# Patient Record
Sex: Female | Born: 1970 | Race: White | Hispanic: No | Marital: Married | Smoking: Never smoker
Health system: Southern US, Community
[De-identification: ages and names within clinical notes are randomized; demographics above are authoritative.]

## PROBLEM LIST (undated history)

## (undated) DIAGNOSIS — E7800 Pure hypercholesterolemia, unspecified: Secondary | ICD-10-CM

## (undated) DIAGNOSIS — N920 Excessive and frequent menstruation with regular cycle: Secondary | ICD-10-CM

## (undated) DIAGNOSIS — N390 Urinary tract infection, site not specified: Secondary | ICD-10-CM

## (undated) DIAGNOSIS — R87619 Unspecified abnormal cytological findings in specimens from cervix uteri: Secondary | ICD-10-CM

## (undated) DIAGNOSIS — F419 Anxiety disorder, unspecified: Secondary | ICD-10-CM

## (undated) HISTORY — PX: LITHOTRIPSY: SUR834

## (undated) HISTORY — DX: Urinary tract infection, site not specified: N39.0

## (undated) HISTORY — DX: Anxiety disorder, unspecified: F41.9

## (undated) HISTORY — DX: Unspecified abnormal cytological findings in specimens from cervix uteri: R87.619

## (undated) HISTORY — DX: Excessive and frequent menstruation with regular cycle: N92.0

---

## 1997-06-01 HISTORY — PX: LEEP: SHX91

## 2006-03-26 ENCOUNTER — Ambulatory Visit: Payer: Self-pay | Admitting: Unknown Physician Specialty

## 2006-04-08 ENCOUNTER — Ambulatory Visit: Payer: Self-pay | Admitting: Urology

## 2006-05-06 ENCOUNTER — Ambulatory Visit: Payer: Self-pay | Admitting: Urology

## 2006-05-13 ENCOUNTER — Ambulatory Visit: Payer: Self-pay | Admitting: Urology

## 2006-05-21 ENCOUNTER — Ambulatory Visit: Payer: Self-pay | Admitting: Urology

## 2006-06-28 ENCOUNTER — Ambulatory Visit: Payer: Self-pay | Admitting: Urology

## 2007-02-21 IMAGING — US US RENAL KIDNEY
1 series · 17 of 25 positions shown · non-contrast
Comparison: none

REASON FOR EXAM: Nephrolithiasis
COMMENTS:

PROCEDURE:     US  - US KIDNEY BILATERAL  - June 28, 2006 [DATE]
RESULT:     The RIGHT kidney measures 11.3 cm in length and the LEFT kidney
measures 11.2 cm in length. No calcified or noncalcified stones were
identified. There is no hydronephrosis. Survey views of the urinary bladder
are normal.

[Series 1: us renal kidney · 17 of 33 slices shown]
[im 1/33]
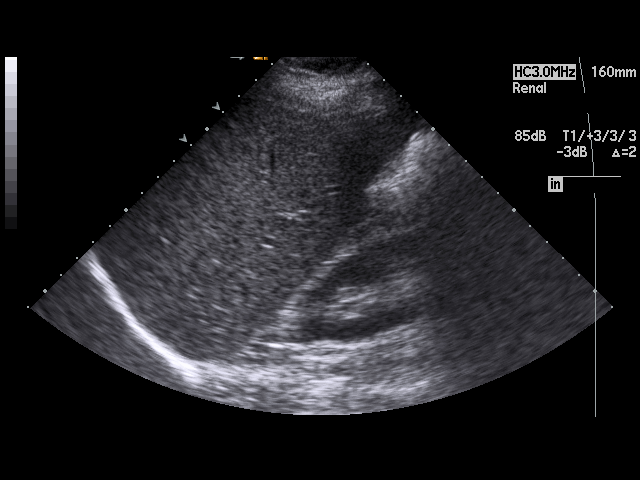
[im 3/33]
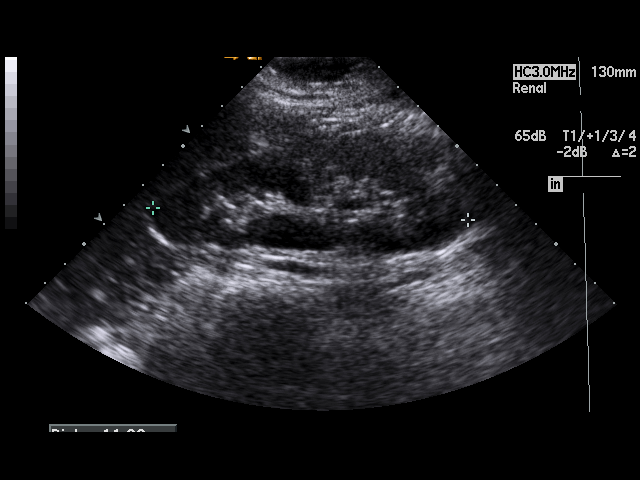
[im 5/33]
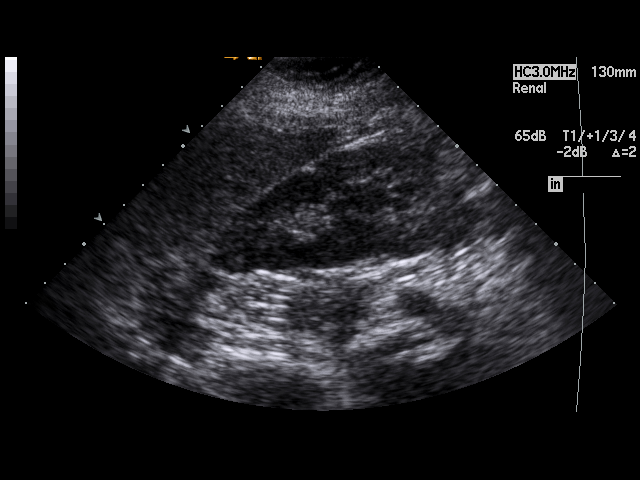
[im 7/33]
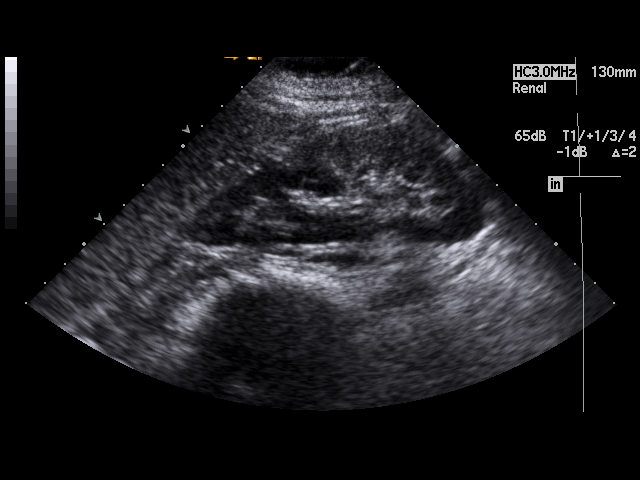
[im 9/33]
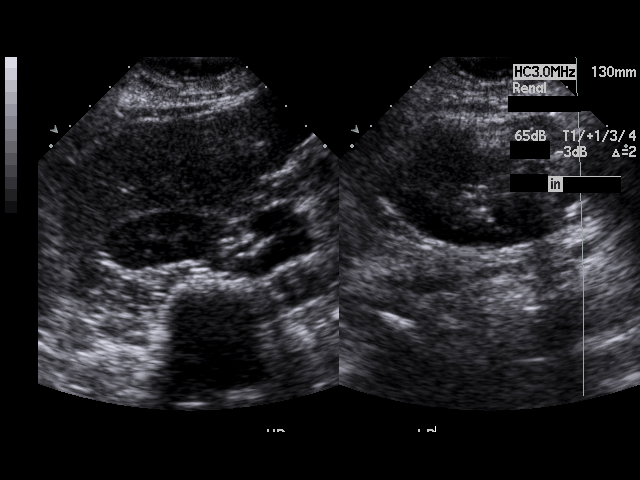
[im 11/33]
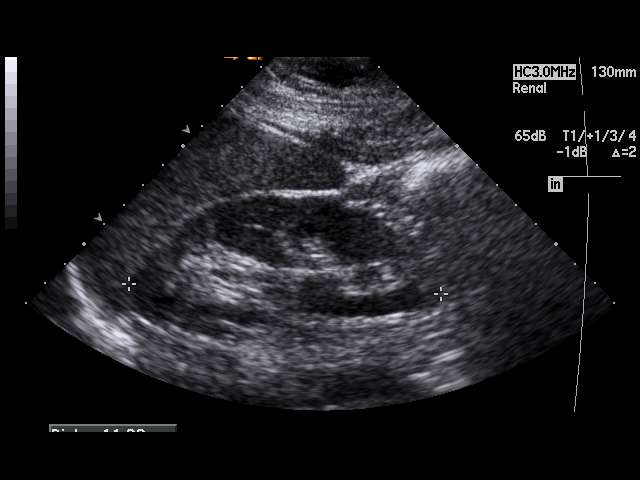
[im 13/33]
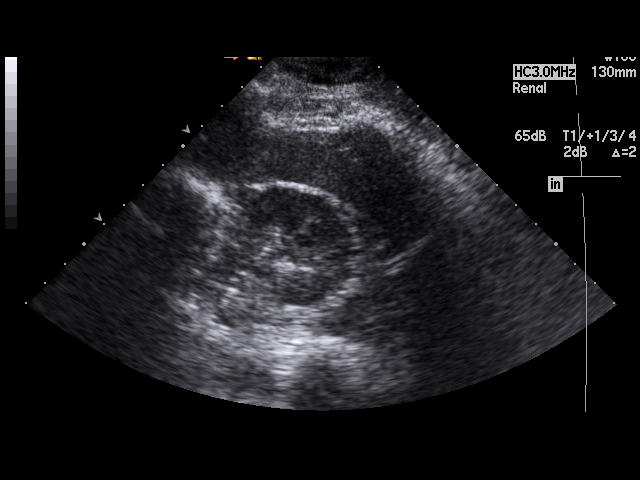
[im 15/33]
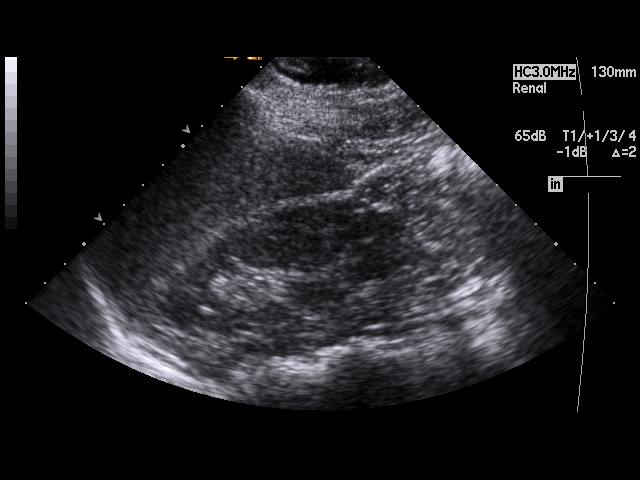
[im 17/33]
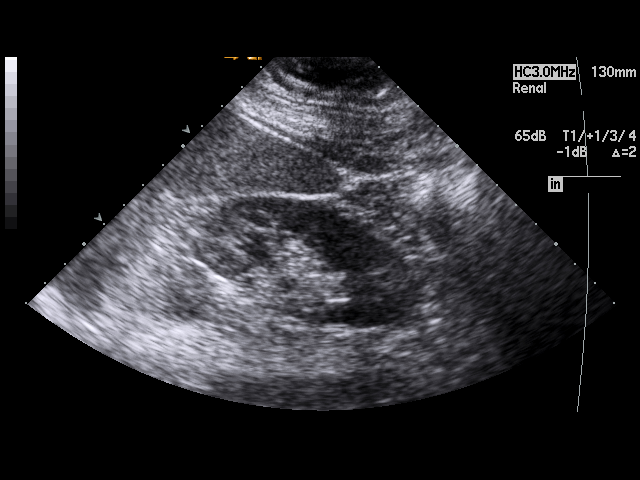
[im 18/33]
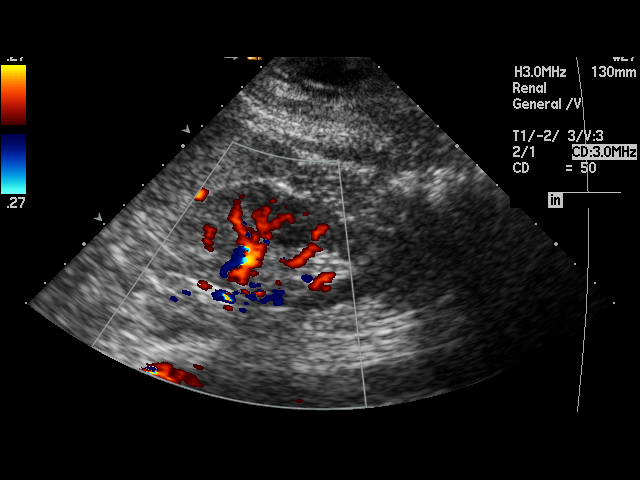
[im 21/33]
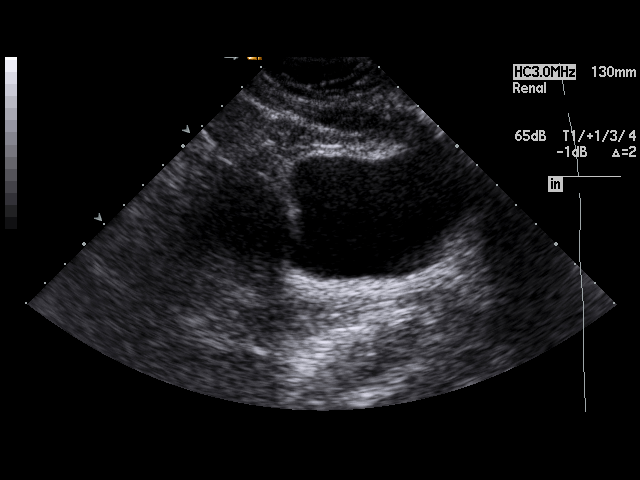
[im 22/33]
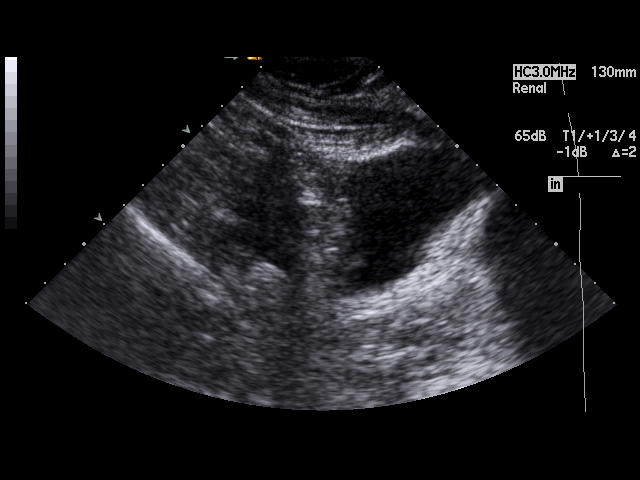
[im 25/33]
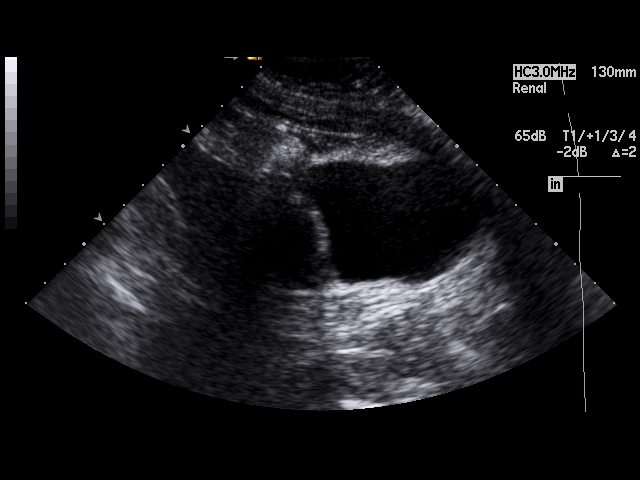
[im 26/33]
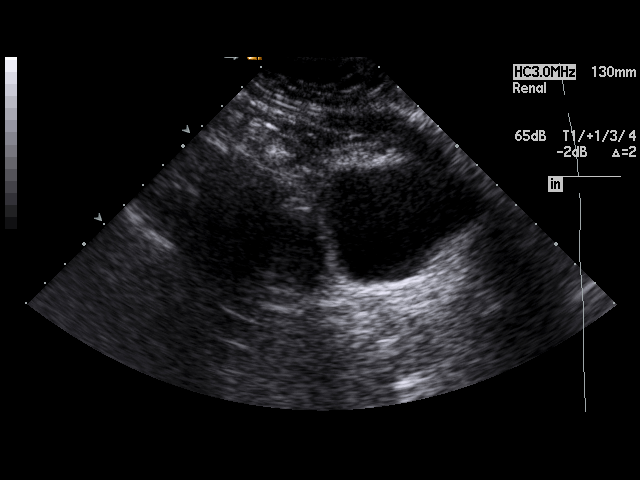
[im 29/33]
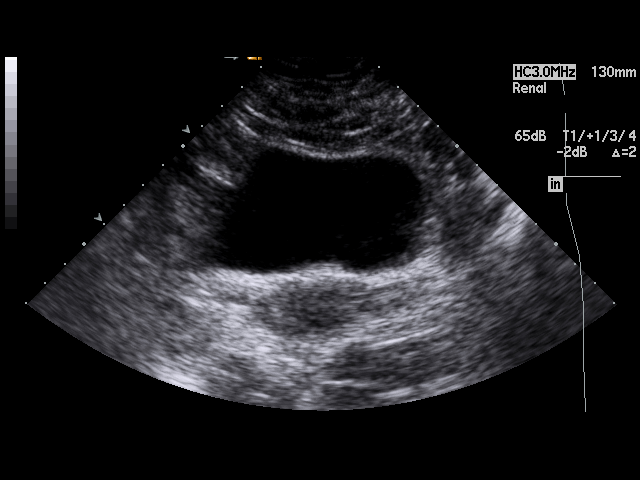
[im 30/33]
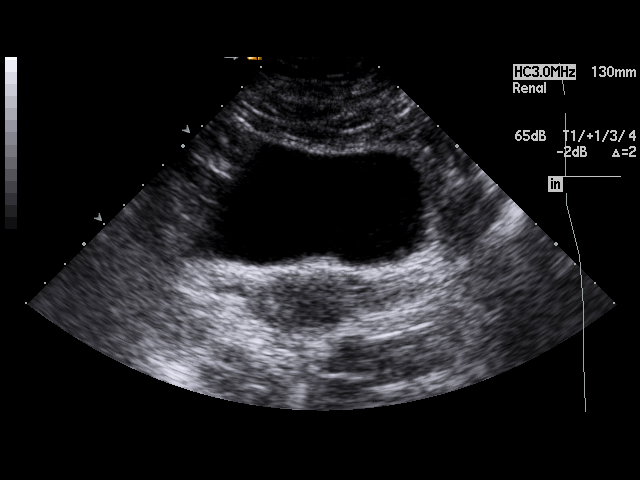
[im 33/33]
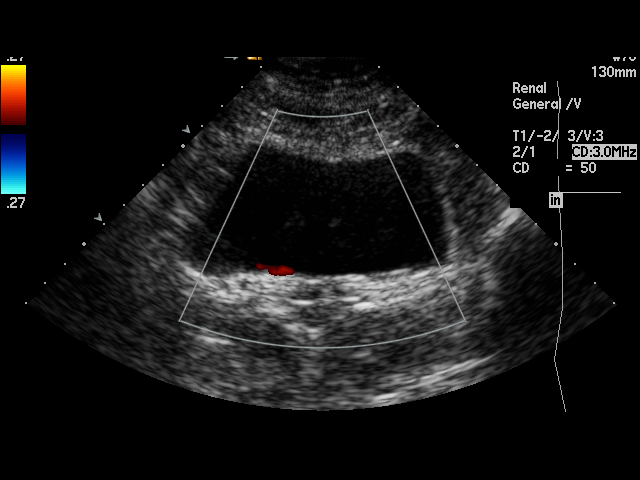

[17 of 25 positions shown; findings below may reference images not displayed]

IMPRESSION: I do not see evidence of urinary tract obstruction or
non-obstructing stones.

## 2009-04-11 ENCOUNTER — Ambulatory Visit: Payer: Self-pay

## 2013-05-23 ENCOUNTER — Ambulatory Visit: Payer: Self-pay | Admitting: Obstetrics and Gynecology

## 2013-10-04 ENCOUNTER — Ambulatory Visit: Payer: Self-pay | Admitting: Family Medicine

## 2013-10-11 ENCOUNTER — Ambulatory Visit: Payer: Self-pay | Admitting: Family Medicine

## 2013-11-17 LAB — HM PAP SMEAR: HM PAP: NEGATIVE

## 2014-01-25 ENCOUNTER — Ambulatory Visit: Payer: Self-pay | Admitting: Emergency Medicine

## 2014-10-09 DIAGNOSIS — R87619 Unspecified abnormal cytological findings in specimens from cervix uteri: Secondary | ICD-10-CM | POA: Insufficient documentation

## 2014-10-09 DIAGNOSIS — F419 Anxiety disorder, unspecified: Secondary | ICD-10-CM | POA: Insufficient documentation

## 2014-10-09 DIAGNOSIS — N946 Dysmenorrhea, unspecified: Secondary | ICD-10-CM | POA: Insufficient documentation

## 2014-10-09 DIAGNOSIS — N390 Urinary tract infection, site not specified: Secondary | ICD-10-CM | POA: Insufficient documentation

## 2014-11-02 ENCOUNTER — Other Ambulatory Visit: Payer: Self-pay | Admitting: Obstetrics and Gynecology

## 2014-11-02 DIAGNOSIS — Z1231 Encounter for screening mammogram for malignant neoplasm of breast: Secondary | ICD-10-CM

## 2014-11-05 ENCOUNTER — Ambulatory Visit
Admission: EM | Admit: 2014-11-05 | Discharge: 2014-11-05 | Disposition: A | Discharge status: Home / Self Care | Payer: BLUE CROSS/BLUE SHIELD | Attending: Family Medicine | Admitting: Family Medicine

## 2014-11-05 DIAGNOSIS — Z79899 Other long term (current) drug therapy: Secondary | ICD-10-CM | POA: Diagnosis not present

## 2014-11-05 DIAGNOSIS — N390 Urinary tract infection, site not specified: Secondary | ICD-10-CM | POA: Insufficient documentation

## 2014-11-05 DIAGNOSIS — R300 Dysuria: Secondary | ICD-10-CM | POA: Diagnosis present

## 2014-11-05 LAB — URINALYSIS COMPLETE WITH MICROSCOPIC (ARMC ONLY)
Bilirubin Urine: NEGATIVE
GLUCOSE, UA: NEGATIVE mg/dL
KETONES UR: NEGATIVE mg/dL
Nitrite: NEGATIVE
Protein, ur: NEGATIVE mg/dL
SPECIFIC GRAVITY, URINE: 1.03 (ref 1.005–1.030)
pH: 6 (ref 5.0–8.0)

## 2014-11-05 MED ORDER — CIPROFLOXACIN HCL 500 MG PO TABS: 500.0000 mg | ORAL_TABLET | Freq: Two times a day (BID) | ORAL | Status: DC

## 2014-11-05 MED ORDER — ONDANSETRON 8 MG PO TBDP: 8.0000 mg | ORAL_TABLET | Freq: Two times a day (BID) | ORAL | Status: DC

## 2014-11-05 NOTE — ED Notes (Signed)
Since this morning. Pt reports she noticed irration with "wiping" yesterday. States the sx are worsening. Pt states she felt feverish earlier today, but did not record temp. Pt denies flank pain. Pt states she is also experiencing groin pain and nausea.

## 2014-11-05 NOTE — ED Provider Notes (Signed)
CSN: [id number]     Arrival date & time 11/05/14  1904 History   First MD Initiated Contact with Patient 11/05/14 1956     Chief Complaint  Patient presents with  . Dysuria   (Consider location/radiation/quality/duration/timing/severity/associated sxs/prior Treatment) Patient is a 44 y.o. female presenting with dysuria. The history is provided by the patient.  Dysuria Pain quality:  Burning and aching Pain severity:  Mild Onset quality:  Sudden Duration:  1 day Timing:  Constant Chronicity:  New Recent urinary tract infections: no   Associated symptoms: fever and nausea   Risk factors: no hx of pyelonephritis     History reviewed. No pertinent past medical history. Past Surgical History  Procedure Laterality Date  . Leep  1999  . Lithotripsy  1998, 2008    x 2   No family history on file. History  Substance Use Topics  . Smoking status: Never Smoker   . Smokeless tobacco: Never Used  . Alcohol Use: Yes     Comment: moderate/weekly   OB History    No data available     Review of Systems  Constitutional: Positive for fever.  Gastrointestinal: Positive for nausea.  Genitourinary: Positive for dysuria.    Allergies  Macrobid  Home Medications   Prior to Admission medications   Medication Sig Start Date End Date Taking? Authorizing Provider  buPROPion (WELLBUTRIN XL) 300 MG 24 hr tablet Take 300 mg by mouth daily.   Yes Historical Provider, MD  levonorgestrel (MIRENA) 20 MCG/24HR IUD 1 each by Intrauterine route once.   Yes Historical Provider, MD  ciprofloxacin (CIPRO) 500 MG tablet Take 1 tablet (500 mg total) by mouth every 12 (twelve) hours. 11/05/14   Payton Mccallumrlando Jaelyne Deeg, MD  ondansetron (ZOFRAN ODT) 8 MG disintegrating tablet Take 1 tablet (8 mg total) by mouth 2 (two) times daily. 11/05/14   Payton Mccallumrlando Kaitlynne Wenz, MD   BP 122/68 mmHg  Pulse 80  Temp(Src) 98.5 F (36.9 C) (Oral)  Resp 16  Ht 5\' 4"  (1.626 m)  Wt 175 lb (79.379 kg)  BMI 30.02 kg/m2  SpO2 100% Physical  Exam  Constitutional: She appears well-developed and well-nourished. No distress.  Pulmonary/Chest: Effort normal. No respiratory distress.  Abdominal: Soft. Bowel sounds are normal. She exhibits no distension and no mass. There is tenderness (mild suprapubic). There is no rebound and no guarding.  Skin: She is not diaphoretic.  Nursing note and vitals reviewed.   ED Course  Procedures (including critical care time) Labs Review Labs Reviewed  URINALYSIS COMPLETEWITH MICROSCOPIC ([facility] ONLY) - Abnormal; Notable for the following:    Hgb urine dipstick TRACE (*)    Leukocytes, UA 1+ (*)    Bacteria, UA MANY (*)    Squamous Epithelial / LPF 0-5 (*)    All other components within normal limits    Imaging Review No results found.   MDM   1. UTI (lower urinary tract infection)    Discharge Medication List as of 11/05/2014  8:18 PM    START taking these medications   Details  ciprofloxacin (CIPRO) 500 MG tablet Take 1 tablet (500 mg total) by mouth every 12 (twelve) hours., Starting 11/05/2014, Until Discontinued, Normal    ondansetron (ZOFRAN ODT) 8 MG disintegrating tablet Take 1 tablet (8 mg total) by mouth 2 (two) times daily., Starting 11/05/2014, Until Discontinued, Normal      Plan: 1. Test results and diagnosis reviewed with patient 2. rx as per orders; risks, benefits, potential side effects reviewed with patient  3. Recommend supportive treatment with increased fluids 4. F/u prn if symptoms worsen or don't improve    Payton Mccallum, MD 11/05/14 2021

## 2014-11-08 ENCOUNTER — Ambulatory Visit
Admission: RE | Admit: 2014-11-08 | Discharge: 2014-11-08 | Disposition: A | Discharge status: Home / Self Care | Payer: BLUE CROSS/BLUE SHIELD | Source: Ambulatory Visit | Attending: Obstetrics and Gynecology | Admitting: Obstetrics and Gynecology

## 2014-11-08 DIAGNOSIS — Z1231 Encounter for screening mammogram for malignant neoplasm of breast: Secondary | ICD-10-CM | POA: Insufficient documentation

## 2014-11-09 NOTE — Progress Notes (Signed)
Quick Note:  Please let her know I have reviewed her MMG and it was normal ______

## 2014-11-23 ENCOUNTER — Encounter: Payer: Self-pay | Admitting: Obstetrics and Gynecology

## 2014-11-23 ENCOUNTER — Ambulatory Visit (INDEPENDENT_AMBULATORY_CARE_PROVIDER_SITE_OTHER): Payer: BLUE CROSS/BLUE SHIELD | Admitting: Obstetrics and Gynecology

## 2014-11-23 VITALS — BP 110/81 | HR 83 | Ht 64.0 in | Wt 174.7 lb

## 2014-11-23 DIAGNOSIS — B3731 Acute candidiasis of vulva and vagina: Secondary | ICD-10-CM

## 2014-11-23 DIAGNOSIS — B373 Candidiasis of vulva and vagina: Secondary | ICD-10-CM

## 2014-11-23 DIAGNOSIS — E669 Obesity, unspecified: Secondary | ICD-10-CM | POA: Diagnosis not present

## 2014-11-23 DIAGNOSIS — Z01419 Encounter for gynecological examination (general) (routine) without abnormal findings: Secondary | ICD-10-CM

## 2014-11-23 MED ORDER — TERCONAZOLE 0.4 % VA CREA: 1.0000 | TOPICAL_CREAM | Freq: Every day | VAGINAL | Status: DC

## 2014-11-23 MED ORDER — BUPROPION HCL ER (XL) 300 MG PO TB24: 300.0000 mg | ORAL_TABLET | Freq: Every day | ORAL | Status: DC

## 2014-11-23 NOTE — Progress Notes (Signed)
Subjective:   Stacy Larson is a 44 y.o. No obstetric history on file. Caucasian female here for a routine well-woman exam.  Patient's last menstrual period was 11/06/2014 (exact date).    Current complaints: vaginal itching- used OTC monistat x 3 days with no relief; just finished antibiotics for UTI PCP: ?       does desire labs, but will return in 3 weeks fasting  Social History: Sexual: heterosexual Marital Status: married Living situation: with spouse Occupation: self-employed Tobacco/alcohol: no tobacco use Illicit drugs: no history of illicit drug use  The following portions of the patient's history were reviewed and updated as appropriate: allergies, current medications, past family history, past medical history, past social history, past surgical history and problem list.  Past Medical History History reviewed. No pertinent past medical history.  Past Surgical History Past Surgical History  Procedure Laterality Date  . Leep  1999  . Lithotripsy  1998, 2008    x 2    Gynecologic History No obstetric history on file.  Patient's last menstrual period was 11/06/2014 (exact date). Contraception: IUD Last Pap: 2015. Results were: normal Last mammogram: 2014. Results were: normal Last TCS: NA  Obstetric History OB History  No data available    Current Medications Current Outpatient Prescriptions on File Prior to Visit  Medication Sig Dispense Refill  . buPROPion (WELLBUTRIN XL) 300 MG 24 hr tablet Take 300 mg by mouth daily.    Marland Kitchen levonorgestrel (MIRENA) 20 MCG/24HR IUD 1 each by Intrauterine route once.    . ciprofloxacin (CIPRO) 500 MG tablet Take 1 tablet (500 mg total) by mouth every 12 (twelve) hours. (Patient not taking: Reported on 11/23/2014) 14 tablet 0  . ondansetron (ZOFRAN ODT) 8 MG disintegrating tablet Take 1 tablet (8 mg total) by mouth 2 (two) times daily. (Patient not taking: Reported on 11/23/2014) 4 tablet 0   No current facility-administered  medications on file prior to visit.    Review of Systems Patient denies any headaches, blurred vision, shortness of breath, chest pain, abdominal pain, problems with bowel movements, urination, or intercourse.  Objective:  BP 110/81 mmHg  Pulse 83  Ht 5\' 4"  (1.626 m)  Wt 174 lb 11.2 oz (79.243 kg)  BMI 29.97 kg/m2  LMP 11/06/2014 (Exact Date) Physical Exam  General:  Well developed, well nourished, no acute distress. She is alert and oriented x3. Skin:  Warm and dry Neck:  Midline trachea, no thyromegaly or nodules Cardiovascular: Regular rate and rhythm, no murmur heard Lungs:  Effort normal, all lung fields clear to auscultation bilaterally Breasts:  No dominant palpable mass, retraction, or nipple discharge Abdomen:  Soft, non tender, no hepatosplenomegaly or masses Pelvic:  External genitalia is inflammed and tender in appearance.  The vagina is normal in appearance. The cervix is bulbous, with IUD string noted, + think yellow d/c noted c/w yeast.no CMT.  Thin prep pap is not done NA HR HPV cotesting. Uterus is felt to be normal size, shape, and contour.  No adnexal masses or tenderness noted. Rectal: Good sphincter tone, no polyps, or hemorrhoids felt.  Hemoccult: NI Extremities:  No swelling or varicosities noted Psych:  She has a normal mood and affect  Assessment:   1:Healthy well-woman exam 2:Obesity 3:1:Yeast infection  Plan:  1:[place] screening labs ordered- will return in a few weeks for lab draw and weight check (to restart weight loss meds) F/U 1 year for AE, or sooner if needed Mammogram done last week or sooner if problems  Colonoscopy NA or sooner if problems  2:Increase exercise and decrease portion size to help with weight loss, restart meds in 3 weeks after nurse visit.  3: rx for terazol sent in, RTC if no improvement noted.   Melody Elissa Lovett, CNM

## 2014-12-07 ENCOUNTER — Other Ambulatory Visit: Payer: BLUE CROSS/BLUE SHIELD

## 2014-12-07 DIAGNOSIS — Z01419 Encounter for gynecological examination (general) (routine) without abnormal findings: Secondary | ICD-10-CM

## 2014-12-07 DIAGNOSIS — E669 Obesity, unspecified: Secondary | ICD-10-CM

## 2014-12-08 LAB — COMPREHENSIVE METABOLIC PANEL
ALK PHOS: 59 IU/L (ref 39–117)
ALT: 11 IU/L (ref 0–32)
AST: 12 IU/L (ref 0–40)
Albumin/Globulin Ratio: 2 (ref 1.1–2.5)
Albumin: 4.6 g/dL (ref 3.5–5.5)
BILIRUBIN TOTAL: 1.1 mg/dL (ref 0.0–1.2)
BUN / CREAT RATIO: 15 (ref 9–23)
BUN: 13 mg/dL (ref 6–24)
CHLORIDE: 99 mmol/L (ref 97–108)
CO2: 23 mmol/L (ref 18–29)
Calcium: 9.4 mg/dL (ref 8.7–10.2)
Creatinine, Ser: 0.88 mg/dL (ref 0.57–1.00)
GFR calc Af Amer: 93 mL/min/{1.73_m2} (ref >59)
GFR, EST NON AFRICAN AMERICAN: 81 mL/min/{1.73_m2} (ref >59)
Globulin, Total: 2.3 g/dL (ref 1.5–4.5)
Glucose: 81 mg/dL (ref 65–99)
Potassium: 4.7 mmol/L (ref 3.5–5.2)
Sodium: 139 mmol/L (ref 134–144)
Total Protein: 6.9 g/dL (ref 6.0–8.5)

## 2014-12-08 LAB — LIPID PANEL
Chol/HDL Ratio: 3.5 ratio (ref 0.0–4.4)
Cholesterol, Total: 252 mg/dL — ABNORMAL HIGH (ref 100–199)
HDL: 73 mg/dL
LDL Calculated: 161 mg/dL — ABNORMAL HIGH (ref 0–99)
Triglycerides: 89 mg/dL (ref 0–149)
VLDL Cholesterol Cal: 18 mg/dL (ref 5–40)

## 2014-12-08 LAB — HEMOGLOBIN A1C
Est. average glucose Bld gHb Est-mCnc: 108 mg/dL
Hgb A1c MFr Bld: 5.4 % (ref 4.8–5.6)

## 2014-12-08 LAB — VITAMIN D 25 HYDROXY (VIT D DEFICIENCY, FRACTURES): Vit D, 25-Hydroxy: 31.8 ng/mL (ref 30.0–100.0)

## 2014-12-08 LAB — TSH: TSH: 1.67 u[IU]/mL (ref 0.450–4.500)

## 2015-04-05 ENCOUNTER — Ambulatory Visit
Admission: EM | Admit: 2015-04-05 | Discharge: 2015-04-05 | Disposition: A | Discharge status: Home / Self Care | Payer: BLUE CROSS/BLUE SHIELD | Attending: Internal Medicine | Admitting: Internal Medicine

## 2015-04-05 ENCOUNTER — Ambulatory Visit (INDEPENDENT_AMBULATORY_CARE_PROVIDER_SITE_OTHER): Payer: BLUE CROSS/BLUE SHIELD

## 2015-04-05 DIAGNOSIS — R079 Chest pain, unspecified: Secondary | ICD-10-CM | POA: Diagnosis not present

## 2015-04-05 DIAGNOSIS — Z79899 Other long term (current) drug therapy: Secondary | ICD-10-CM | POA: Diagnosis not present

## 2015-04-05 DIAGNOSIS — Z8249 Family history of ischemic heart disease and other diseases of the circulatory system: Secondary | ICD-10-CM | POA: Insufficient documentation

## 2015-04-05 DIAGNOSIS — E7800 Pure hypercholesterolemia, unspecified: Secondary | ICD-10-CM | POA: Diagnosis not present

## 2015-04-05 DIAGNOSIS — E785 Hyperlipidemia, unspecified: Secondary | ICD-10-CM | POA: Insufficient documentation

## 2015-04-05 DIAGNOSIS — Z8489 Family history of other specified conditions: Secondary | ICD-10-CM | POA: Insufficient documentation

## 2015-04-05 HISTORY — DX: Pure hypercholesterolemia, unspecified: E78.00

## 2015-04-05 NOTE — ED Notes (Signed)
States woke at 0030hrs. With a "knot" feeling, "something stuck" mide sternal, radiates through to right thoracic area of back. Slight SOB. "Sweaty". Color good, skin warm and dry

## 2015-04-05 NOTE — ED Provider Notes (Addendum)
CSN: [id number]     Arrival date & time 04/05/15  1448 History   First MD Initiated Contact with Patient 04/05/15 1514     Chief Complaint  Patient presents with  . Chest Pain   HPI  Patient is a 44 year old lady with past history notable for hyperlipidemia, presents today with onset of upper chest discomfort in the night last night, she rates it a little more than a 5 out of 10. It has been worse today when she is up rushing around, such as when she came in from the parking lot. She has been able in the last few weeks to participate in her usual levels of exertion, which include walking and going to the gym. She is recovering from a hand injury, and has not been doing upper extremity work, but she has been doing cardio and walking. She feels short of breath, but not nauseous. She has not been sweaty. Chest discomfort is not pleuritic. She has no leg swelling. No dizziness.  Primary care provider [facility]Bur[facility]. Family history is notable for MI/left mainstem occlusion in her father at age 43. Her paternal grandfather died at age 43 with an MI. Additional past medical history: patient has normal blood pressure, is not diabetic, and is still having periods. 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 She has never smoked.  Past Medical History  Diagnosis Date  . Hypercholesteremia    Past Surgical History  Procedure Laterality Date  . Leep  1999  . Lithotripsy  1998, 2008    x 2   Family History  Problem Relation Age of Onset  . Hyperlipidemia Mother   . Hypertension Father   . Diabetes Father   . Hyperlipidemia Father    Social History  Substance Use Topics  . Smoking status: Never Smoker   . Smokeless tobacco: Never Used  . Alcohol Use: Yes     Comment: moderate/weekly    Review of Systems  All other systems reviewed and are negative.   Allergies   Macrobid  Home Medications   Prior to Admission medications   Medication Sig Start Date End Date Taking? Authorizing Provider  buPROPion (WELLBUTRIN XL) 300 MG 24 hr tablet Take 1 tablet (300 mg total) by mouth daily. 11/23/14  Yes Melody Elissa LovettN Burr, CNM  levonorgestrel (MIRENA) 20 MCG/24HR IUD 1 each by Intrauterine route once.   Yes Historical Provider, MD  Vitamin A Vitamin B ibuprofen                       BP 115/53 mmHg  Pulse 89  Temp(Src) 98.2 F (36.8 C) (Tympanic)  Resp 19  Ht 5\' 4"  (1.626 m)  Wt 175 lb (79.379 kg)  BMI 30.02 kg/m2  SpO2 99%  LMP 03/22/2015 (Approximate)   Physical Exam  Constitutional: She is oriented to person, place, and time. No distress.  Alert, nicely groomed  HENT:  Head: Atraumatic.  Eyes:  Conjugate gaze, no eye redness/drainage  Neck: Neck supple.  Cardiovascular: Normal rate and regular rhythm.  Exam reveals no gallop.   No murmur heard. Mild respiratory variation of heart rate  Pulmonary/Chest: No respiratory distress. She has no wheezes. She has no rales.  Lungs clear, symmetric breath sounds  Abdominal: Soft. She exhibits no distension. There is no tenderness. There is no rebound and no guarding.  Musculoskeletal: Normal range of motion.  No leg swelling  Neurological: She is alert and oriented to person, place, and time.  Skin: Skin is warm and dry.  No cyanosis  Nursing note and vitals reviewed.   ED Course  Procedures (including critical care time)  4 baby aspirin were given in the urgent care.   Review of prior labs: In July 2016, LDL cholesterol was 161 according to [facility].   ECG #1 today at 15:12:01 in the urgent care notable for heart rate of 94, no acute appearing ST or T-wave changes. NSR.  PR intervals 158 ms, QRS duration is 84 ms, corrected QT interval 442 ms. QRS axis is 38.  ECG #2 at 16:55:10 in the urgent care, consistent with previous ECG, no significant change.  NSR with sinus arrhythmia. No acute  appearing ST or T-wave changes.  HR 75; PR 158 msec; QRS 88 msec; QTc 408 msec; QRS axis 63.  CXR: no acute findings.  MDM   1. Family history of premature coronary artery disease   2. Chest pain, unspecified chest pain type   3. Hyperlipidemia    No ecg changes observed in urgent care. Discussed further evaluation in ED due to persistent sx's, family hx premature CAD, and inherent uncertainty in clinical cardiac risk evaluation. Differential diagnosis discussed and presence of cardiac risk factors, as well as risks/benefits of immediate v urgent (early next week) risk stratification. Patient decided to pursue outpatient followup with cardiologist early next week, with option to go to ED for any marked change in discomfort, development of new symptoms (vomiting). I spoke with Dr Waynetta Pean Cardiology on call to put in referral for followup.   Attempt made to enter referral in [facility] via community access.    Eustace Moore, MD 04/06/15 2110  Eustace Moore, MD 04/06/15 2110

## 2015-04-05 NOTE — Discharge Instructions (Signed)
Followup with cardiologist early next week; I will contact [facility] and try to arrange this for you. Please call the urgent care on Monday 11/7 if you have not heard about an appointment by mid morning. Take a baby aspirin daily until the cardiology followup. Go to the emergency room if there is a sudden worsening of your chest pain, or if you develop vomiting or other new/markedly worse symptoms.

## 2015-04-05 NOTE — ED Notes (Signed)
EKG repeated; shown to Dr. Dayton ScrapeMurray

## 2015-11-29 ENCOUNTER — Encounter: Payer: Self-pay | Admitting: Obstetrics and Gynecology

## 2015-11-29 ENCOUNTER — Other Ambulatory Visit: Payer: Self-pay | Admitting: Obstetrics and Gynecology

## 2015-11-29 ENCOUNTER — Ambulatory Visit (INDEPENDENT_AMBULATORY_CARE_PROVIDER_SITE_OTHER): Payer: BLUE CROSS/BLUE SHIELD | Admitting: Obstetrics and Gynecology

## 2015-11-29 VITALS — BP 114/80 | HR 77 | Wt 185.2 lb

## 2015-11-29 DIAGNOSIS — E669 Obesity, unspecified: Secondary | ICD-10-CM | POA: Diagnosis not present

## 2015-11-29 DIAGNOSIS — Z01419 Encounter for gynecological examination (general) (routine) without abnormal findings: Secondary | ICD-10-CM | POA: Diagnosis not present

## 2015-11-29 DIAGNOSIS — Z1231 Encounter for screening mammogram for malignant neoplasm of breast: Secondary | ICD-10-CM

## 2015-11-29 MED ORDER — BUPROPION HCL ER (XL) 300 MG PO TB24: 300.0000 mg | ORAL_TABLET | Freq: Every day | ORAL | Status: DC

## 2015-11-29 MED ORDER — CYANOCOBALAMIN 1000 MCG/ML IJ SOLN: 1000.0000 ug | Freq: Once | INTRAMUSCULAR | Status: DC

## 2015-11-29 NOTE — Progress Notes (Signed)
Subjective:   Stacy Larson is a 45 y.o. G3P0 Caucasian female here for a routine well-woman exam.  No LMP recorded.    Current complaints: weight gain, restarted exercising, and is trying to work on it. Feels really stressed. PCP: ?       Does need labs  Social History: Sexual: heterosexual Marital Status: married Living situation: with family Occupation: unknown occupation Tobacco/alcohol: no tobacco use Illicit drugs: no history of illicit drug use  The following portions of the patient's history were reviewed and updated as appropriate: allergies, current medications, past family history, past medical history, past social history, past surgical history and problem list.  Past Medical History Past Medical History  Diagnosis Date  . Hypercholesteremia   . Abnormal Pap smear of cervix   . Heavy periods   . Anxiety   . UTI (lower urinary tract infection)     Past Surgical History Past Surgical History  Procedure Laterality Date  . Leep  1999  . Lithotripsy  1998, 2008    x 2    Gynecologic History G3P0  No LMP recorded. Contraception: IUD Last Pap: 2015. Results were: normal Last mammogram: 2016. Results were: normal   Obstetric History OB History  Gravida Para Term Preterm AB SAB TAB Ectopic Multiple Living  3         3    # Outcome Date GA Lbr Len/2nd Weight Sex Delivery Anes PTL Lv  3 Gravida 2005    M Vag-Spont   Y  2 Gravida 2002    F Vag-Spont   Y  1 Gravida 1998    F Vag-Spont   Y      Current Medications Current Outpatient Prescriptions on File Prior to Visit  Medication Sig Dispense Refill  . buPROPion (WELLBUTRIN XL) 300 MG 24 hr tablet Take 1 tablet (300 mg total) by mouth daily. 90 tablet 4  . levonorgestrel (MIRENA) 20 MCG/24HR IUD 1 each by Intrauterine route once.    . ondansetron (ZOFRAN ODT) 8 MG disintegrating tablet Take 1 tablet (8 mg total) by mouth 2 (two) times daily. (Patient not taking: Reported on 11/23/2014) 4 tablet 0  .  terconazole (TERAZOL 7) 0.4 % vaginal cream Place 1 applicator vaginally at bedtime. (Patient not taking: Reported on 11/29/2015) 45 g 0   No current facility-administered medications on file prior to visit.    Review of Systems Patient denies any headaches, blurred vision, shortness of breath, chest pain, abdominal pain, problems with bowel movements, urination, or intercourse.  Objective:  BP 114/80 mmHg  Pulse 77  Wt 185 lb 3.2 oz (84.006 kg) Physical Exam  General:  Well developed, well nourished, no acute distress. She is alert and oriented x3. Skin:  Warm and dry Neck:  Midline trachea, no thyromegaly or nodules Cardiovascular: Regular rate and rhythm, no murmur heard Lungs:  Effort normal, all lung fields clear to auscultation bilaterally Breasts:  No dominant palpable mass, retraction, or nipple discharge Abdomen:  Soft, non tender, no hepatosplenomegaly or masses Pelvic:  External genitalia is normal in appearance.  The vagina is normal in appearance. The cervix is bulbous, no CMT. IUD string noted  Thin prep pap is not done . Uterus is felt to be normal size, shape, and contour.  No adnexal masses or tenderness noted. Extremities:  No swelling or varicosities noted Psych:  She has a normal mood and affect  Assessment:   Healthy well-woman exam Obesity Depression IUD checkup hyperlipidemia Plan:  Labs repeated F/U  1 year for AE, or sooner if needed Mammogram scheduled or sooner if problems   Melody Suzan NailerN Shambley, CNM

## 2015-11-29 NOTE — Patient Instructions (Signed)
  Place annual gynecologic exam patient instructions here.  Thank you for enrolling in MyChart. Please follow the instructions below to securely access your online medical record. MyChart allows you to send messages to your doctor, view your test results, manage appointments, and more.   How Do I Sign Up? 1. In your Internet browser, go to [employer] and enter [email or web address]. 2. Click on the Sign Up Now link in the Sign In box. You will see the New Member Sign Up page. 3. Enter your MyChart Access Code exactly as it appears below. You will not need to use this code after you've completed the sign-up process. If you do not sign up before the expiration date, you must request a new code.  MyChart Access Code: [email or web address] Expires: 12/06/2015  3:10 AM  4. Enter your Social Security Number ([id number]) and Date of Birth (mm/dd/yyyy) as indicated and click Submit. You will be taken to the next sign-up page. 5. Create a MyChart ID. This will be your MyChart login ID and cannot be changed, so think of one that is secure and easy to remember. 6. Create a MyChart password. You can change your password at any time. 7. Enter your Password Reset Question and Answer. This can be used at a later time if you forget your password.  8. Enter your e-mail address. You will receive e-mail notification when new information is available in MyChart. 9. Click Sign Up. You can now view your medical record.   Additional Information Remember, MyChart is NOT to be used for urgent needs. For medical emergencies, dial 911.

## 2015-12-02 ENCOUNTER — Other Ambulatory Visit: Payer: BLUE CROSS/BLUE SHIELD

## 2015-12-02 ENCOUNTER — Ambulatory Visit: Payer: BLUE CROSS/BLUE SHIELD

## 2015-12-03 LAB — COMPREHENSIVE METABOLIC PANEL
ALK PHOS: 53 IU/L (ref 39–117)
ALT: 26 IU/L (ref 0–32)
AST: 15 IU/L (ref 0–40)
Albumin/Globulin Ratio: 2 (ref 1.2–2.2)
Albumin: 4.3 g/dL (ref 3.5–5.5)
BILIRUBIN TOTAL: 0.9 mg/dL (ref 0.0–1.2)
BUN/Creatinine Ratio: 15 (ref 9–23)
BUN: 14 mg/dL (ref 6–24)
CHLORIDE: 101 mmol/L (ref 96–106)
CO2: 22 mmol/L (ref 18–29)
Calcium: 9.2 mg/dL (ref 8.7–10.2)
Creatinine, Ser: 0.95 mg/dL (ref 0.57–1.00)
GFR calc non Af Amer: 73 mL/min/{1.73_m2} (ref >59)
GFR, EST AFRICAN AMERICAN: 84 mL/min/{1.73_m2} (ref >59)
GLUCOSE: 91 mg/dL (ref 65–99)
Globulin, Total: 2.1 g/dL (ref 1.5–4.5)
Potassium: 4.4 mmol/L (ref 3.5–5.2)
Sodium: 137 mmol/L (ref 134–144)
TOTAL PROTEIN: 6.4 g/dL (ref 6.0–8.5)

## 2015-12-03 LAB — HEMOGLOBIN A1C
Est. average glucose Bld gHb Est-mCnc: 103 mg/dL
Hgb A1c MFr Bld: 5.2 % (ref 4.8–5.6)

## 2015-12-03 LAB — LIPID PANEL
CHOLESTEROL TOTAL: 221 mg/dL — AB (ref 100–199)
Chol/HDL Ratio: 4.2 ratio units (ref 0.0–4.4)
HDL: 53 mg/dL (ref >39)
LDL Calculated: 136 mg/dL — ABNORMAL HIGH (ref 0–99)
TRIGLYCERIDES: 161 mg/dL — AB (ref 0–149)
VLDL CHOLESTEROL CAL: 32 mg/dL (ref 5–40)

## 2015-12-03 LAB — THYROID PANEL WITH TSH
FREE THYROXINE INDEX: 2 (ref 1.2–4.9)
T3 Uptake Ratio: 27 % (ref 24–39)
T4, Total: 7.3 ug/dL (ref 4.5–12.0)
TSH: 1.89 u[IU]/mL (ref 0.450–4.500)

## 2015-12-05 LAB — CYTOLOGY - PAP

## 2015-12-11 ENCOUNTER — Telehealth: Payer: Self-pay | Admitting: Obstetrics and Gynecology

## 2015-12-11 NOTE — Telephone Encounter (Signed)
This pt called and said Amy called her about her lab results and left a msg. She thinks something is wrong and said she would like a call to ease her mind.

## 2015-12-11 NOTE — Telephone Encounter (Signed)
Pt notified of lab result and of need to repeat in 3 months.

## 2015-12-12 ENCOUNTER — Ambulatory Visit
Admission: RE | Admit: 2015-12-12 | Discharge: 2015-12-12 | Disposition: A | Discharge status: Home / Self Care | Payer: BLUE CROSS/BLUE SHIELD | Source: Ambulatory Visit | Attending: Obstetrics and Gynecology | Admitting: Obstetrics and Gynecology

## 2015-12-12 DIAGNOSIS — Z1231 Encounter for screening mammogram for malignant neoplasm of breast: Secondary | ICD-10-CM | POA: Diagnosis not present

## 2015-12-16 ENCOUNTER — Telehealth: Payer: Self-pay | Admitting: *Deleted

## 2015-12-16 NOTE — Telephone Encounter (Signed)
Spoke with pt

## 2015-12-16 NOTE — Telephone Encounter (Signed)
-----   Message from EllsworthMelody N Shambley, [place] sent at 12/04/2015  2:09 PM EDT ----- Please let her know lab results, I want to repeat her labs in 3 months, and needs to continue to work hard at reducing cholesterol in diet.

## 2015-12-30 ENCOUNTER — Telehealth: Payer: Self-pay | Admitting: Obstetrics and Gynecology

## 2015-12-30 NOTE — Telephone Encounter (Signed)
PT IS COMING TOMORROW FOR NURSE VISIT AND NEEDS TO ASKS A QUESTION BEFORE SHE COMES. CAN YOU PLEASE GIVE HER A CALL.

## 2015-12-31 ENCOUNTER — Ambulatory Visit: Payer: BLUE CROSS/BLUE SHIELD

## 2016-12-24 ENCOUNTER — Encounter: Payer: BLUE CROSS/BLUE SHIELD | Admitting: Obstetrics and Gynecology

## 2017-02-25 ENCOUNTER — Ambulatory Visit (INDEPENDENT_AMBULATORY_CARE_PROVIDER_SITE_OTHER): Payer: Medicaid Other | Admitting: Obstetrics and Gynecology

## 2017-02-25 ENCOUNTER — Encounter: Payer: Self-pay | Admitting: Obstetrics and Gynecology

## 2017-02-25 VITALS — BP 97/70 | HR 83 | Ht 64.0 in | Wt 188.2 lb

## 2017-02-25 DIAGNOSIS — E785 Hyperlipidemia, unspecified: Secondary | ICD-10-CM

## 2017-02-25 DIAGNOSIS — F988 Other specified behavioral and emotional disorders with onset usually occurring in childhood and adolescence: Secondary | ICD-10-CM

## 2017-02-25 DIAGNOSIS — Z0000 Encounter for general adult medical examination without abnormal findings: Secondary | ICD-10-CM

## 2017-02-25 DIAGNOSIS — Z01419 Encounter for gynecological examination (general) (routine) without abnormal findings: Secondary | ICD-10-CM

## 2017-02-25 MED ORDER — AMPHETAMINE-DEXTROAMPHETAMINE 5 MG PO TABS: 5.0000 mg | ORAL_TABLET | Freq: Two times a day (BID) | ORAL | 0 refills | Status: DC

## 2017-02-25 NOTE — Progress Notes (Signed)
Subjective:   Stacy Larson is a 46 y.o. G40P0 Caucasian female here for a routine well-woman exam.  No LMP recorded.    Current complaints: anxiety worse, more at home due to ADD. PCP: me       Does need labs  Social History: Sexual: heterosexual Marital Status: married Living situation: with family Occupation: unknown occupation Tobacco/alcohol: no tobacco use Illicit drugs: no history of illicit drug use  The following portions of the patient's history were reviewed and updated as appropriate: allergies, current medications, past family history, past medical history, past social history, past surgical history and problem list.  Past Medical History Past Medical History:  Diagnosis Date  . Abnormal Pap smear of cervix   . Anxiety   . Heavy periods   . Hypercholesteremia   . UTI (lower urinary tract infection)     Past Surgical History Past Surgical History:  Procedure Laterality Date  . LEEP  1999  . LITHOTRIPSY  1998, 2008   x 2    Gynecologic History G3P0  No LMP recorded. Contraception: IUD Last Pap: 10/2015. Results were: normal Last mammogram: 11/2015. Results were: normal   Obstetric History OB History  Gravida Para Term Preterm AB Living  3         3  SAB TAB Ectopic Multiple Live Births          3    # Outcome Date GA Lbr Len/2nd Weight Sex Delivery Anes PTL Lv  3 Gravida 2005    M Vag-Spont   LIV  2 Gravida 2002    F Vag-Spont   LIV  1 Gravida 1998    F Vag-Spont   LIV      Current Medications Current Outpatient Prescriptions on File Prior to Visit  Medication Sig Dispense Refill  . levonorgestrel (MIRENA) 20 MCG/24HR IUD 1 each by Intrauterine route once.    Marland Kitchen buPROPion (WELLBUTRIN XL) 300 MG 24 hr tablet Take 1 tablet (300 mg total) by mouth daily. (Patient not taking: Reported on 02/25/2017) 90 tablet 4  . cyanocobalamin (,VITAMIN B-12,) 1000 MCG/ML injection Inject 1 mL (1,000 mcg total) into the muscle once. (Patient not taking:  Reported on 02/25/2017) 10 mL 1   No current facility-administered medications on file prior to visit.     Review of Systems Patient denies any headaches, blurred vision, shortness of breath, chest pain, abdominal pain, problems with bowel movements, urination, or intercourse.  Objective:  BP 97/70   Pulse 83   Ht  (1.626 m)   Wt 188 lb 3.2 oz (85.4 kg)   BMI 32.30 kg/m  Physical Exam  General:  Well developed, well nourished, no acute distress. She is alert and oriented x3. Skin:  Warm and dry Neck:  Midline trachea, no thyromegaly or nodules Cardiovascular: Regular rate and rhythm, no murmur heard Lungs:  Effort normal, all lung fields clear to auscultation bilaterally Breasts:  No dominant palpable mass, retraction, or nipple discharge Abdomen:  Soft, non tender, no hepatosplenomegaly or masses Pelvic:  External genitalia is normal in appearance.  The vagina is normal in appearance. The cervix is bulbous, no CMT. IUD string is noted  Thin prep pap is not done . Uterus is felt to be normal size, shape, and contour.  No adnexal masses or tenderness noted. Extremities:  No swelling or varicosities noted Psych:  She has a normal mood and affect  Assessment:   Healthy well-woman exam ADD Anxiety Obesity   Plan:  Labs obtained-will  follow up accordingly Will try adderral (consider concerta in future) F/U 1 year for Ae, or sooner if needed Mammogram ordered or sooner if problems   Melody Suzan Nailer, CNM

## 2017-02-25 NOTE — Patient Instructions (Signed)
Thank you for enrolling in MyChart. Please follow the instructions below to securely access your online medical record. MyChart allows you to send messages to your doctor, view your test results, renew your prescriptions, schedule appointments, and more.  How Do I Sign Up? 1. In your Internet browser, go to [email or web address] 2. Click on the New  User? link in the Sign In box.  3. Enter your MyChart Access Code exactly as it appears below. You will not need to use this code after you have completed the sign-up process. If you do not sign up before the expiration date, you must request a new code. MyChart Access Code: [email or web address] Expires: 04/11/2017  9:02 AM  4. Enter the last four digits of your Social Security Number (xxxx) and Date of Birth (mm/dd/yyyy) as indicated and click Next. You will be taken to the next sign-up page. 5. Create a MyChart ID. This will be your MyChart login ID and cannot be changed, so think of one that is secure and easy to remember. 6. Create a MyChart password. You can change your password at any time. 7. Enter your Password Reset Question and Answer and click Next. This can be used at a later time if you forget your password.  8. Select your communication preference, and if applicable enter your e-mail address. You will receive e-mail notification when new information is available in MyChart by choosing to receive e-mail notifications and filling in your e-mail. 9. Click Sign In. You can now view your medical record.   Additional Information If you have questions, you can email [email or web address] or call [phone] to talk to our MyChart staff. Remember, MyChart is NOT to be used for urgent needs. For medical emergencies, dial 911.

## 2017-02-26 ENCOUNTER — Telehealth: Payer: Self-pay | Admitting: Obstetrics and Gynecology

## 2017-02-26 ENCOUNTER — Other Ambulatory Visit: Payer: Self-pay | Admitting: Obstetrics and Gynecology

## 2017-02-26 MED ORDER — TERCONAZOLE 0.4 % VA CREA: 1.0000 | TOPICAL_CREAM | Freq: Every day | VAGINAL | 0 refills | Status: AC

## 2017-02-26 NOTE — Telephone Encounter (Signed)
Pt is requesting a call back to see if she can get this Rx today or if she should go to walk in clinic. Please advise. Thanks TNP

## 2017-02-26 NOTE — Telephone Encounter (Signed)
Please let her know I sent in a prscription

## 2017-02-26 NOTE — Telephone Encounter (Signed)
Patient needs something better - stronger than otc yeast infection treatment  Please call as soon as possible - she really needs something today   [facility] [place]

## 2017-02-26 NOTE — Telephone Encounter (Signed)
pls advise

## 2017-02-27 LAB — NMR, LIPOPROFILE
CHOLESTEROL: 224 mg/dL — AB (ref 100–199)
HDL Cholesterol by NMR: 67 mg/dL (ref >39)
HDL Particle Number: 44 umol/L (ref >=30.5)
LDL Particle Number: 1928 nmol/L — ABNORMAL HIGH (ref <1000)
LDL SIZE: 21 nm (ref >20.5)
LDL-C: 136 mg/dL — ABNORMAL HIGH (ref 0–99)
LP-IR SCORE: 66 — AB (ref <=45)
Small LDL Particle Number: 637 nmol/L — ABNORMAL HIGH (ref <=527)
TRIGLYCERIDES BY NMR: 103 mg/dL (ref 0–149)

## 2017-02-27 LAB — COMPREHENSIVE METABOLIC PANEL
A/G RATIO: 2 (ref 1.2–2.2)
ALT: 16 IU/L (ref 0–32)
AST: 18 IU/L (ref 0–40)
Albumin: 4.6 g/dL (ref 3.5–5.5)
Alkaline Phosphatase: 52 IU/L (ref 39–117)
BILIRUBIN TOTAL: 0.8 mg/dL (ref 0.0–1.2)
BUN/Creatinine Ratio: 17 (ref 9–23)
BUN: 16 mg/dL (ref 6–24)
CALCIUM: 9.6 mg/dL (ref 8.7–10.2)
CHLORIDE: 102 mmol/L (ref 96–106)
CO2: 25 mmol/L (ref 20–29)
Creatinine, Ser: 0.95 mg/dL (ref 0.57–1.00)
GFR calc Af Amer: 84 mL/min/{1.73_m2} (ref >59)
GFR, EST NON AFRICAN AMERICAN: 73 mL/min/{1.73_m2} (ref >59)
GLOBULIN, TOTAL: 2.3 g/dL (ref 1.5–4.5)
Glucose: 94 mg/dL (ref 65–99)
POTASSIUM: 5.3 mmol/L — AB (ref 3.5–5.2)
SODIUM: 138 mmol/L (ref 134–144)
Total Protein: 6.9 g/dL (ref 6.0–8.5)

## 2017-02-27 LAB — TSH: TSH: 1.16 u[IU]/mL (ref 0.450–4.500)

## 2017-02-27 LAB — HEMOGLOBIN A1C
ESTIMATED AVERAGE GLUCOSE: 94 mg/dL
Hgb A1c MFr Bld: 4.9 % (ref 4.8–5.6)

## 2017-03-30 ENCOUNTER — Ambulatory Visit
Admission: RE | Admit: 2017-03-30 | Discharge: 2017-03-30 | Disposition: A | Discharge status: Home / Self Care | Payer: Medicaid Other | Source: Ambulatory Visit | Attending: Obstetrics and Gynecology | Admitting: Obstetrics and Gynecology

## 2017-03-30 ENCOUNTER — Encounter (INDEPENDENT_AMBULATORY_CARE_PROVIDER_SITE_OTHER): Payer: Self-pay

## 2017-03-30 DIAGNOSIS — Z1231 Encounter for screening mammogram for malignant neoplasm of breast: Secondary | ICD-10-CM | POA: Insufficient documentation

## 2017-03-30 DIAGNOSIS — Z01419 Encounter for gynecological examination (general) (routine) without abnormal findings: Secondary | ICD-10-CM

## 2018-03-16 ENCOUNTER — Encounter

## 2018-03-16 ENCOUNTER — Encounter: Payer: Self-pay | Admitting: Obstetrics and Gynecology

## 2018-03-16 ENCOUNTER — Ambulatory Visit (INDEPENDENT_AMBULATORY_CARE_PROVIDER_SITE_OTHER): Payer: BLUE CROSS/BLUE SHIELD | Admitting: Obstetrics and Gynecology

## 2018-03-16 ENCOUNTER — Other Ambulatory Visit (HOSPITAL_COMMUNITY)
Admission: RE | Admit: 2018-03-16 | Discharge: 2018-03-16 | Disposition: A | Discharge status: Home / Self Care | Payer: BLUE CROSS/BLUE SHIELD | Source: Ambulatory Visit | Attending: Obstetrics and Gynecology | Admitting: Obstetrics and Gynecology

## 2018-03-16 VITALS — BP 115/84 | HR 79 | Ht 63.5 in | Wt 189.6 lb

## 2018-03-16 DIAGNOSIS — Z01419 Encounter for gynecological examination (general) (routine) without abnormal findings: Secondary | ICD-10-CM

## 2018-03-16 DIAGNOSIS — Z01411 Encounter for gynecological examination (general) (routine) with abnormal findings: Secondary | ICD-10-CM | POA: Diagnosis not present

## 2018-03-16 DIAGNOSIS — E785 Hyperlipidemia, unspecified: Secondary | ICD-10-CM | POA: Diagnosis not present

## 2018-03-16 DIAGNOSIS — Z23 Encounter for immunization: Secondary | ICD-10-CM

## 2018-03-16 DIAGNOSIS — E669 Obesity, unspecified: Secondary | ICD-10-CM | POA: Diagnosis not present

## 2018-03-16 MED ORDER — BUPROPION HCL ER (XL) 150 MG PO TB24: 150.0000 mg | ORAL_TABLET | Freq: Every day | ORAL | 6 refills | Status: DC

## 2018-03-16 MED ORDER — AMPHETAMINE-DEXTROAMPHETAMINE 15 MG PO TABS: 15.0000 mg | ORAL_TABLET | Freq: Two times a day (BID) | ORAL | 0 refills | Status: AC

## 2018-03-16 NOTE — Progress Notes (Signed)
Subjective:   Stacy Larson is a 47 y.o. G3P0 Caucasian female here for a routine well-woman exam.  No LMP recorded. (Menstrual status: IUD).    Current complaints: feels edgy and easily agitated at home. Stopped adderall due to insurance.  Also wants to see what cholesterol is this year before discussing starting meds.  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  Past Medical History Past Medical History:  Diagnosis Date  . Abnormal Pap smear of cervix   . Anxiety   . Heavy periods   . Hypercholesteremia   . UTI (lower urinary tract infection)     Past Surgical History Past Surgical History:  Procedure Laterality Date  . LEEP  1999  . LITHOTRIPSY  1998, 2008   x 2    Gynecologic History G3P0  No LMP recorded. (Menstrual status: IUD). Contraception: IUD Last Pap: 2017. Results were: normal Last mammogram: 03/2017. Results were: normal   Obstetric History OB History  Gravida Para Term Preterm AB Living  3         3  SAB TAB Ectopic Multiple Live Births          3    # Outcome Date GA Lbr Len/2nd Weight Sex Delivery Anes PTL Lv  3 Gravida 2005    M Vag-Spont   LIV  2 Gravida 2002    F Vag-Spont   LIV  1 Gravida 1998    F Vag-Spont   LIV    Current Medications Current Outpatient Medications on File Prior to Visit  Medication Sig Dispense Refill  . levonorgestrel (MIRENA) 20 MCG/24HR IUD 1 each by Intrauterine route once.    Marland Kitchen amphetamine-dextroamphetamine (ADDERALL) 5 MG tablet Take 1 tablet (5 mg total) by mouth 2 (two) times daily with a meal.  (Patient not taking: Reported on 03/16/2018) 60 tablet 0  . terconazole (TERAZOL 7) 0.4 % vaginal cream Place 1 applicator vaginally at bedtime. (Patient not taking: Reported on 03/16/2018) 45 g 0   No current facility-administered medications on file prior to visit.     Review of Systems Patient denies any headaches, blurred vision, shortness of breath, chest pain, abdominal pain, problems with bowel movements, urination, or intercourse.  Objective:  BP 115/84   Pulse 79   Ht 5' 3.5" (1.613 m)   Wt 189 lb 9.6 oz (86 kg)   BMI 33.06 kg/m  Physical Exam  General:  Well developed, well nourished, no acute distress. She is alert and oriented x3. Skin:  Warm and dry Neck:  Midline trachea, no thyromegaly or nodules Cardiovascular: Regular rate and rhythm, no murmur heard Lungs:  Effort normal, all lung fields clear to auscultation bilaterally Breasts:  No dominant palpable mass, retraction, or nipple discharge Abdomen:  Soft, non tender, no hepatosplenomegaly or masses Pelvic:  External genitalia is normal in appearance.  The vagina is normal in appearance. The cervix is bulbous, no CMT. IUD string noted. Thin prep pap is done with HR HPV cotesting. Uterus is felt to be normal size, shape, and contour.  No adnexal masses or tenderness noted. Extremities:  No swelling or varicosities noted Psych:  She has a normal mood and affect  Assessment:   Healthy well-woman exam Hyperlipidemia Obesity Needs flu vaccine IUD check (placed 2016)   Plan:  Labs obtained-will follow up accordingly Flu vaccine given. F/U 1 year for AE, or sooner if needed Mammogram ordered  Phuc Kluttz Suzan Nailer, CNM

## 2018-03-16 NOTE — Patient Instructions (Signed)
Thank you for enrolling in MyChart. Please follow the instructions below to securely access your online medical record. MyChart allows you to send messages to your doctor, view your test results, renew your prescriptions, schedule appointments, and more.  How Do I Sign Up? 1. In your Internet browser, go to [email or web address] 2. Click on the New  User? link in the Sign In box.  3. Enter your MyChart Access Code exactly as it appears below. You will not need to use this code after you have completed the sign-up process. If you do not sign up before the expiration date, you must request a new code. MyChart Access Code: [email or web address] Expires: 04/30/2018 10:46 AM  4. Enter the last four digits of your Social Security Number (xxxx) and Date of Birth (mm/dd/yyyy) as indicated and click Next. You will be taken to the next sign-up page. 5. Create a MyChart ID. This will be your MyChart login ID and cannot be changed, so think of one that is secure and easy to remember. 6. Create a MyChart password. You can change your password at any time. 7. Enter your Password Reset Question and Answer and click Next. This can be used at a later time if you forget your password.  8. Select your communication preference, and if applicable enter your e-mail address. You will receive e-mail notification when new information is available in MyChart by choosing to receive e-mail notifications and filling in your e-mail. 9. Click Sign In. You can now view your medical record.   Additional Information If you have questions, you can email [email or web address] or call [phone] to talk to our MyChart staff. Remember, MyChart is NOT to be used for urgent needs. For medical emergencies, dial 911. ``

## 2018-03-17 LAB — CYTOLOGY - PAP
DIAGNOSIS: NEGATIVE
HPV: NOT DETECTED

## 2018-05-17 ENCOUNTER — Ambulatory Visit
Admission: RE | Admit: 2018-05-17 | Discharge: 2018-05-17 | Disposition: A | Discharge status: Home / Self Care | Payer: BLUE CROSS/BLUE SHIELD | Source: Ambulatory Visit | Attending: Obstetrics and Gynecology | Admitting: Obstetrics and Gynecology

## 2018-05-17 DIAGNOSIS — Z01419 Encounter for gynecological examination (general) (routine) without abnormal findings: Secondary | ICD-10-CM | POA: Insufficient documentation

## 2018-08-22 ENCOUNTER — Telehealth: Payer: Self-pay | Admitting: Obstetrics and Gynecology

## 2018-08-22 NOTE — Telephone Encounter (Signed)
noted 

## 2018-08-22 NOTE — Telephone Encounter (Signed)
Patient called stating she is ready to start on cholesterol medication. She will come in for necessary blood work once the covid restrictions are over. [facility] [place]. Thanks.

## 2018-08-26 ENCOUNTER — Telehealth: Payer: Self-pay | Admitting: Obstetrics and Gynecology

## 2018-08-26 NOTE — Telephone Encounter (Signed)
Will send to [facility] to discuss with M[facility].

## 2018-08-26 NOTE — Telephone Encounter (Signed)
The patient called a little upset due to her not receiving a call back from a message that was sent back earlier in the week. It seems there might have been some miscommunication as to what the patient needs. The patient stated that she would like her cholesterol medication to be sent into her pharmacy, since she is not able to come into the office for a non-essential office visit. The patient requested a call back today if possible. I informed the patient that I will send a revised message to another nurse that is in the office today. Please advise.

## 2018-08-26 NOTE — Telephone Encounter (Signed)
Pt was called back to speak to her more about her concerns and call to the office. Pt stated that due to her job she was not able to come in to get her blood work. Pt stated that she would like to come in and get that done but she understood why she couldn't. Pt wants to know if she could get some medication for her high cholesterol until she is able to come in to get her blood work done and as soon as she is able to come in she would get the blood work done.  Pt asked if she could do a televisit to speak with MS more about her high cholesterol.

## 2018-08-29 ENCOUNTER — Other Ambulatory Visit: Payer: Self-pay | Admitting: Obstetrics and Gynecology

## 2018-08-29 MED ORDER — ROSUVASTATIN CALCIUM 10 MG PO TABS: 10.0000 mg | ORAL_TABLET | Freq: Every day | ORAL | 11 refills | Status: AC

## 2018-08-29 NOTE — Telephone Encounter (Signed)
pls advise

## 2018-09-01 NOTE — Telephone Encounter (Signed)
I sent in prescription for crestor on 3/30. Please have her come in for labs in 3 months.

## 2018-09-06 ENCOUNTER — Other Ambulatory Visit: Payer: Self-pay | Admitting: *Deleted

## 2018-09-06 MED ORDER — BUPROPION HCL ER (XL) 150 MG PO TB24: 150.0000 mg | ORAL_TABLET | Freq: Every day | ORAL | 2 refills | Status: AC

## 2019-03-22 ENCOUNTER — Encounter: Payer: Self-pay | Admitting: Obstetrics and Gynecology

## 2019-03-22 ENCOUNTER — Other Ambulatory Visit: Payer: Self-pay

## 2019-03-22 ENCOUNTER — Ambulatory Visit (INDEPENDENT_AMBULATORY_CARE_PROVIDER_SITE_OTHER): Payer: BLUE CROSS/BLUE SHIELD | Admitting: Obstetrics and Gynecology

## 2019-03-22 VITALS — BP 116/81 | HR 73 | Ht 64.0 in | Wt 192.6 lb

## 2019-03-22 DIAGNOSIS — E669 Obesity, unspecified: Secondary | ICD-10-CM | POA: Diagnosis not present

## 2019-03-22 DIAGNOSIS — Z23 Encounter for immunization: Secondary | ICD-10-CM

## 2019-03-22 DIAGNOSIS — Z6833 Body mass index (BMI) 33.0-33.9, adult: Secondary | ICD-10-CM

## 2019-03-22 DIAGNOSIS — F419 Anxiety disorder, unspecified: Secondary | ICD-10-CM

## 2019-03-22 DIAGNOSIS — E785 Hyperlipidemia, unspecified: Secondary | ICD-10-CM

## 2019-03-22 DIAGNOSIS — F988 Other specified behavioral and emotional disorders with onset usually occurring in childhood and adolescence: Secondary | ICD-10-CM

## 2019-03-22 DIAGNOSIS — Z01419 Encounter for gynecological examination (general) (routine) without abnormal findings: Secondary | ICD-10-CM

## 2019-03-22 MED ORDER — CYANOCOBALAMIN 1000 MCG/ML IJ SOLN: 1000.0000 ug | INTRAMUSCULAR | 1 refills | Status: AC

## 2019-03-22 MED ORDER — PHENTERMINE HCL 37.5 MG PO TABS: 37.5000 mg | ORAL_TABLET | Freq: Every day | ORAL | 2 refills | Status: AC

## 2019-03-22 MED ORDER — CYANOCOBALAMIN 1000 MCG/ML IJ SOLN
1000.0000 ug | Freq: Once | INTRAMUSCULAR | Status: AC
  Administered 2019-03-22: 1000 ug via INTRAMUSCULAR

## 2019-03-22 NOTE — Progress Notes (Signed)
Patient is here for an annual exam. LPS 03/16/18 WNL. Flu shot given

## 2019-03-22 NOTE — Progress Notes (Signed)
Subjective:   Stacy Larson is a 48 y.o. G3P0 Caucasian female here for a routine well-woman exam.  No LMP recorded. (Menstrual status: IUD).    Current complaints: none PCP: Weeks,Cynthia       does desire labs  Social History: Sexual: heterosexual Marital Status: married Living situation: with family Occupation: unknown occupation Tobacco/alcohol: no tobacco use Illicit drugs: no history of illicit drug use  The following portions of the patient's history were reviewed and updated as appropriate: allergies, current medications, past family history, past medical history, past social history, past surgical history and problem list.  Past Medical History Past Medical History:  Diagnosis Date  . Abnormal Pap smear of cervix   . Anxiety   . Heavy periods   . Hypercholesteremia   . UTI (lower urinary tract infection)     Past Surgical History Past Surgical History:  Procedure Laterality Date  . LEEP  1999  . LITHOTRIPSY  1998, 2008   x 2    Gynecologic History G3P0  No LMP recorded. (Menstrual status: IUD). Contraception: IUD Last Pap: 03/2018. Results were: normal Last mammogram: 05/2018. Results were: normal   Obstetric History OB History  Gravida Para Term Preterm AB Living  3         3  SAB TAB Ectopic Multiple Live Births          3    # Outcome Date GA Lbr Len/2nd Weight Sex Delivery Anes PTL Lv  3 Gravida 2005    M Vag-Spont   LIV  2 Gravida 2002    F Vag-Spont   LIV  1 Gravida 1998    F Vag-Spont   LIV    Current Medications Current Outpatient Medications on File Prior to Visit  Medication Sig Dispense Refill  . buPROPion (WELLBUTRIN XL) 150 MG 24 hr tablet Take 1 tablet (150 mg total) by mouth daily. 90 tablet 2  . Cholecalciferol (VITAMIN D3) 25 MCG (1000 UT) CAPS Take by mouth.    . levonorgestrel (MIRENA) 20 MCG/24HR IUD 1 each by Intrauterine route once.    Marland Kitchen amphetamine-dextroamphetamine (ADDERALL) 15 MG tablet Take 1 tablet by mouth 2  (two) times daily. (Patient not taking: Reported on 03/22/2019) 60 tablet 0  . rosuvastatin (CRESTOR) 10 MG tablet Take 1 tablet (10 mg total) by mouth at bedtime. (Patient not taking: Reported on 03/22/2019) 30 tablet 11  . terconazole (TERAZOL 7) 0.4 % vaginal cream Place 1 applicator vaginally at bedtime. (Patient not taking: Reported on 03/16/2018) 45 g 0   No current facility-administered medications on file prior to visit.     Review of Systems Patient denies any headaches, blurred vision, shortness of breath, chest pain, abdominal pain, problems with bowel movements, urination, or intercourse.  Objective:  BP 116/81   Pulse 73   Ht 5\' 4"  (1.626 m)   Wt 192 lb 9 oz (87.3 kg)   BMI 33.05 kg/m  Physical Exam  General:  Well developed, well nourished, no acute distress. She is alert and oriented x3. Skin:  Warm and dry Neck:  Midline trachea, no thyromegaly or nodules Cardiovascular: Regular rate and rhythm, no murmur heard Lungs:  Effort normal, all lung fields clear to auscultation bilaterally Breasts:  No dominant palpable mass, retraction, or nipple discharge Abdomen:  Soft, non tender, no hepatosplenomegaly or masses Pelvic:  External genitalia is normal in appearance.  The vagina is normal in appearance. The cervix is bulbous, no CMT.  Thin prep pap is not done.  IUD string noted. Uterus is felt to be normal size, shape, and contour.  No adnexal masses or tenderness noted. Extremities:  No swelling or varicosities noted Psych:  She has a normal mood and affect  Assessment:   Healthy well-woman exam Needs flu vaccine Hyperlipidemia BMI 33 ADD IUD check(placed 2015)   Plan:  Desires to continue with IUD in hopes of menopause before it has expired at 7 years.  Has not been taking adderall since 2 months after last annual exam as it makes her not be able to sleep, feels like wellbutrin is helping with that. Does desires adipex and B12 again to help with weight loss, and  plans to only take 1/2 tablet daily. Has a coworker that is nurse and gives her B12 injection.  F/U 1 year for AE, or sooner if needed Mammogram ordered  Chelly Dombeck Rockney Ghee, CNM

## 2019-03-23 LAB — LIPID PANEL
Chol/HDL Ratio: 3.8 ratio (ref 0.0–4.4)
Cholesterol, Total: 240 mg/dL — ABNORMAL HIGH (ref 100–199)
HDL: 63 mg/dL (ref >39)
LDL Chol Calc (NIH): 148 mg/dL — ABNORMAL HIGH (ref 0–99)
Triglycerides: 163 mg/dL — ABNORMAL HIGH (ref 0–149)
VLDL Cholesterol Cal: 29 mg/dL (ref 5–40)

## 2019-03-23 LAB — COMPREHENSIVE METABOLIC PANEL
ALT: 18 IU/L (ref 0–32)
AST: 17 IU/L (ref 0–40)
Albumin/Globulin Ratio: 2.3 — ABNORMAL HIGH (ref 1.2–2.2)
Albumin: 4.6 g/dL (ref 3.8–4.8)
Alkaline Phosphatase: 59 IU/L (ref 39–117)
BUN/Creatinine Ratio: 14 (ref 9–23)
BUN: 12 mg/dL (ref 6–24)
Bilirubin Total: 1 mg/dL (ref 0.0–1.2)
CO2: 24 mmol/L (ref 20–29)
Calcium: 9.3 mg/dL (ref 8.7–10.2)
Chloride: 99 mmol/L (ref 96–106)
Creatinine, Ser: 0.88 mg/dL (ref 0.57–1.00)
GFR calc Af Amer: 90 mL/min/{1.73_m2} (ref >59)
GFR calc non Af Amer: 78 mL/min/{1.73_m2} (ref >59)
Globulin, Total: 2 g/dL (ref 1.5–4.5)
Glucose: 92 mg/dL (ref 65–99)
Potassium: 4.4 mmol/L (ref 3.5–5.2)
Sodium: 139 mmol/L (ref 134–144)
Total Protein: 6.6 g/dL (ref 6.0–8.5)

## 2019-03-23 LAB — HEMOGLOBIN A1C
Est. average glucose Bld gHb Est-mCnc: 100 mg/dL
Hgb A1c MFr Bld: 5.1 % (ref 4.8–5.6)

## 2019-04-06 ENCOUNTER — Telehealth: Payer: Self-pay | Admitting: Obstetrics and Gynecology

## 2019-04-06 NOTE — Telephone Encounter (Signed)
The patient called and stated that she has not heard anything back in regards to her lab results. Pt is requesting a call back. Please advise.

## 2019-05-02 ENCOUNTER — Telehealth: Payer: Self-pay | Admitting: Certified Nurse Midwife

## 2019-05-02 NOTE — Telephone Encounter (Signed)
Pt also states she has called already and requested lab results and to let her know if her medication needs adjusted based on any results. Please call pt.

## 2019-05-02 NOTE — Telephone Encounter (Addendum)
Pt called to say [facility] needs order sent to them today so pt can schedule appt on Dec 18th for annual mammogram.  Pt request confirmation phone call that this has been done.

## 2019-05-05 ENCOUNTER — Telehealth: Payer: Self-pay | Admitting: Certified Nurse Midwife

## 2019-05-05 ENCOUNTER — Other Ambulatory Visit: Payer: Self-pay

## 2019-05-05 ENCOUNTER — Telehealth: Payer: Self-pay

## 2019-05-05 DIAGNOSIS — Z1231 Encounter for screening mammogram for malignant neoplasm of breast: Secondary | ICD-10-CM

## 2019-05-05 NOTE — Telephone Encounter (Signed)
Pt called in need order for a mammogram  At [facility] health sent over. They will not sche.. her till order is made. Please advise

## 2019-05-05 NOTE — Telephone Encounter (Signed)
mychart message sent to patient- order for mammogram placed.

## 2019-05-08 ENCOUNTER — Other Ambulatory Visit: Payer: Self-pay | Admitting: Certified Nurse Midwife

## 2019-05-08 DIAGNOSIS — Z1231 Encounter for screening mammogram for malignant neoplasm of breast: Secondary | ICD-10-CM

## 2019-05-11 NOTE — Telephone Encounter (Signed)
LM for patient to return call.

## 2019-05-19 ENCOUNTER — Ambulatory Visit
Admission: RE | Admit: 2019-05-19 | Discharge: 2019-05-19 | Disposition: A | Discharge status: Home / Self Care | Payer: BLUE CROSS/BLUE SHIELD | Source: Ambulatory Visit | Attending: Certified Nurse Midwife | Admitting: Certified Nurse Midwife

## 2019-05-19 ENCOUNTER — Other Ambulatory Visit: Payer: Self-pay

## 2019-05-19 DIAGNOSIS — Z1231 Encounter for screening mammogram for malignant neoplasm of breast: Secondary | ICD-10-CM | POA: Diagnosis not present

## 2019-06-05 ENCOUNTER — Ambulatory Visit: Payer: BLUE CROSS/BLUE SHIELD | Attending: Internal Medicine

## 2019-06-05 DIAGNOSIS — Z20822 Contact with and (suspected) exposure to covid-19: Secondary | ICD-10-CM

## 2019-06-06 LAB — NOVEL CORONAVIRUS, NAA: SARS-CoV-2, NAA: NOT DETECTED

## 2019-06-26 ENCOUNTER — Ambulatory Visit: Payer: BLUE CROSS/BLUE SHIELD | Attending: Internal Medicine

## 2019-06-27 LAB — NOVEL CORONAVIRUS, NAA: SARS-CoV-2, NAA: NOT DETECTED

## 2019-07-02 ENCOUNTER — Telehealth: Payer: Self-pay

## 2019-07-02 NOTE — Telephone Encounter (Signed)
Spoke with pt on Friday 06/29/18.  Pt expressed multiple issues since MNS has been gone.   She was not contacted in regard to her labs.- MNS reviewed her labs but never contacted her. She called requesting the results. JW called and left her a message to return a call. She states she never got this message.  Her daughter had the wrong b/c sent into the pharmacy.- Error on our part. There were multiple ocps on file.   Her daughter did not get her records faxed [place] like she was told - Explained to pt that we have 30 days to release records.   Apologized to patient for all the confusion and not meeting her expectations. [facility] definitely has room for improvement.  Thanked her for sharing her concerns. Pt appreciative of call.

## 2019-07-02 NOTE — Telephone Encounter (Signed)
-----   Message from [place] sent at 06/27/2019  4:45 PM EST ----- The pt said that she is upset bc mel. Is gone and that she has had a hard time since she has been gone. The pt have to call  the office to get a order for a mammogram. Pts daughter also needed her phys sent over and was upset bc we were short staff.  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

## 2019-07-24 ENCOUNTER — Ambulatory Visit: Payer: BLUE CROSS/BLUE SHIELD | Attending: Internal Medicine

## 2019-07-24 DIAGNOSIS — Z23 Encounter for immunization: Secondary | ICD-10-CM | POA: Insufficient documentation

## 2019-07-24 NOTE — Progress Notes (Signed)
   Covid-19 Vaccination Clinic  Name:  Stacy Larson    MRN: [id number] DOB: 1970-09-26  07/24/2019  [name] was observed post Covid-19 immunization for 15 minutes without incidence. She was provided with Vaccine Information Sheet and instruction to access the V-Safe system.   [name] was instructed to call 911 with any severe reactions post vaccine: Marland Kitchen Difficulty breathing  . Swelling of your face and throat  . A fast heartbeat  . A bad rash all over your body  . Dizziness and weakness    Immunizations Administered    Name Date Dose VIS Date Route   Moderna COVID-19 Vaccine 07/24/2019 10:28 AM 0.5 mL 05/02/2019 Intramuscular   Manufacturer: Moderna   Lot: [id number]   NDC: 62130-865-78

## 2019-08-22 ENCOUNTER — Ambulatory Visit: Payer: BLUE CROSS/BLUE SHIELD | Attending: Internal Medicine

## 2019-08-22 DIAGNOSIS — Z23 Encounter for immunization: Secondary | ICD-10-CM

## 2019-08-22 NOTE — Progress Notes (Signed)
   Covid-19 Vaccination Clinic  Name:  Stacy Larson    MRN: [id number] DOB: 24-Mar-1971  08/22/2019  [name] was observed post Covid-19 immunization for 15 minutes without incident. She was provided with Vaccine Information Sheet and instruction to access the V-Safe system.   [name] was instructed to call 911 with any severe reactions post vaccine: Marland Kitchen Difficulty breathing  . Swelling of face and throat  . A fast heartbeat  . A bad rash all over body  . Dizziness and weakness   Immunizations Administered    Name Date Dose VIS Date Route   Moderna COVID-19 Vaccine 08/22/2019 10:44 AM 0.5 mL 05/02/2019 Intramuscular   Manufacturer: Gala Murdoch   Lot: [id number]   NDC: 53646-803-21

## 2020-01-12 IMAGING — MG DIGITAL SCREENING BILAT W/ TOMO W/ CAD
6 of 10 series · 6 of 30 positions shown · non-contrast
Comparison: Previous exam(s).

CLINICAL DATA: Screening.

EXAM:
DIGITAL SCREENING BILATERAL MAMMOGRAM WITH TOMO AND CAD

[L CC synth-2D]
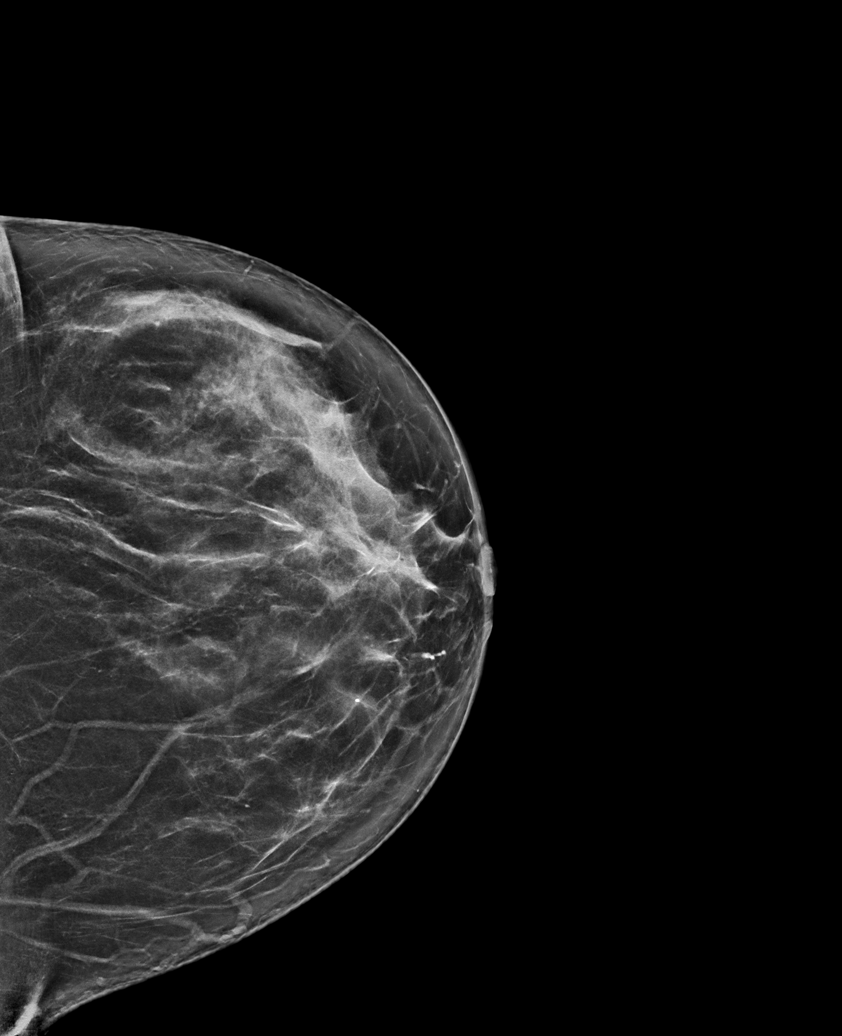

[L CV synth-2D]
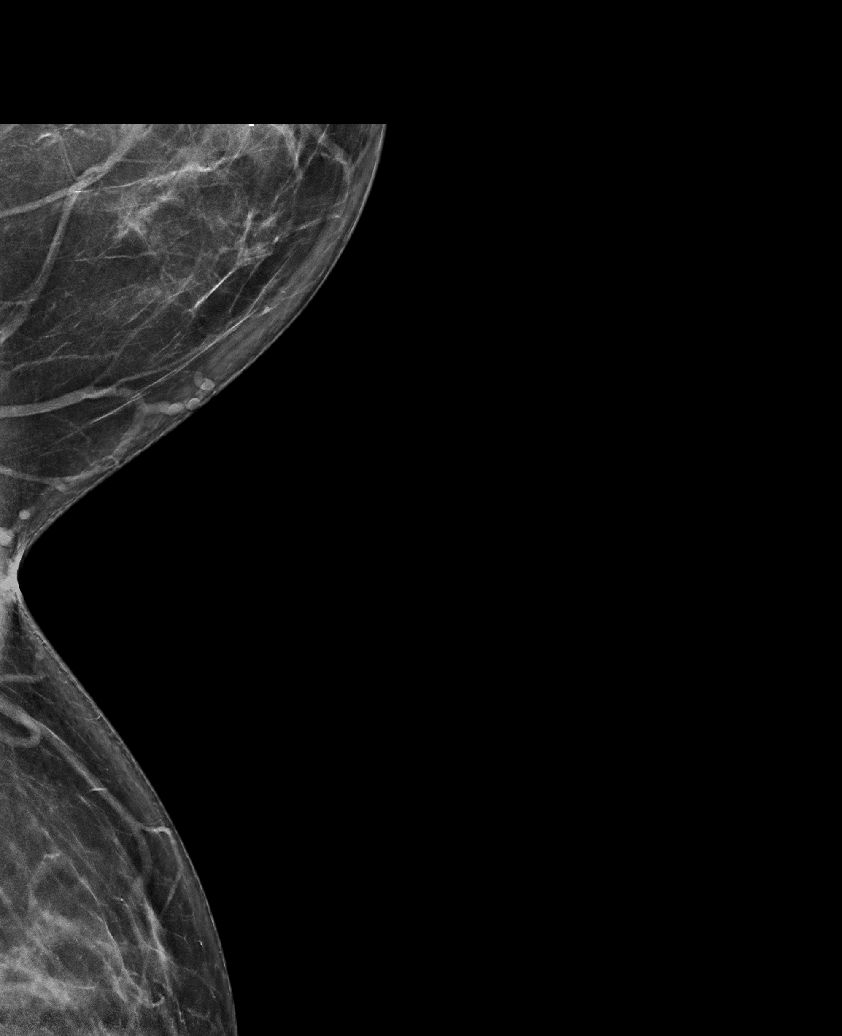

[L MLO synth-2D]
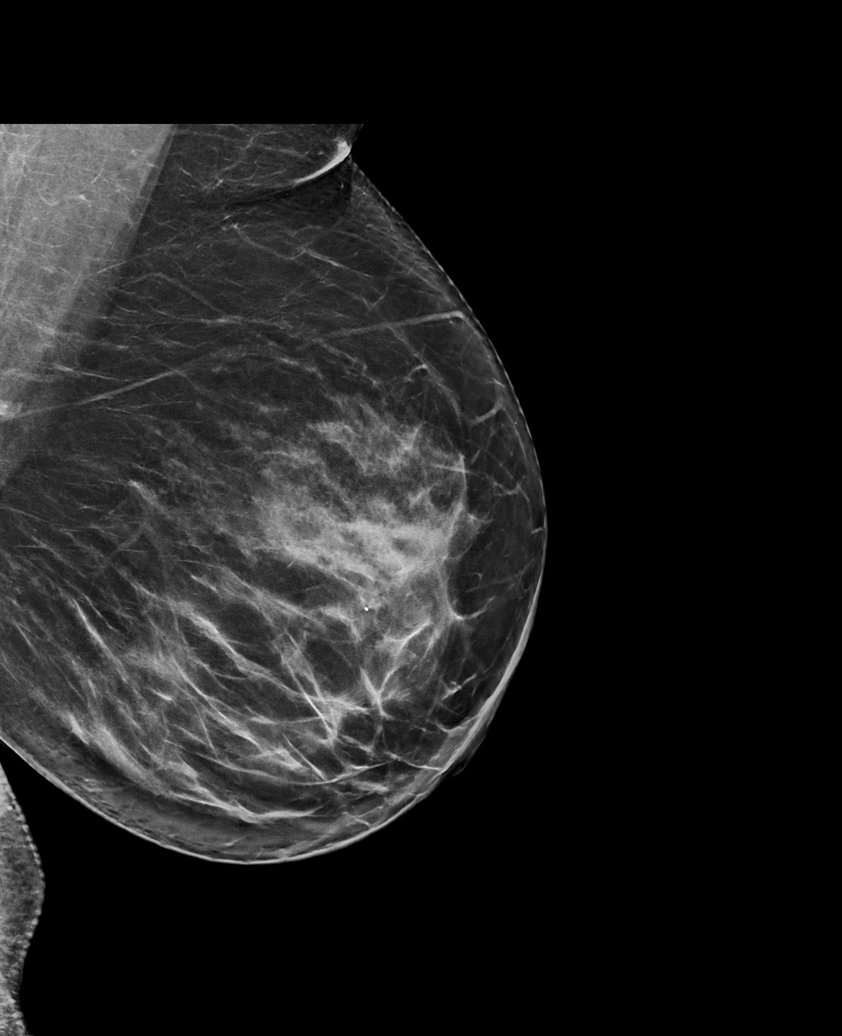

[R CC synth-2D]
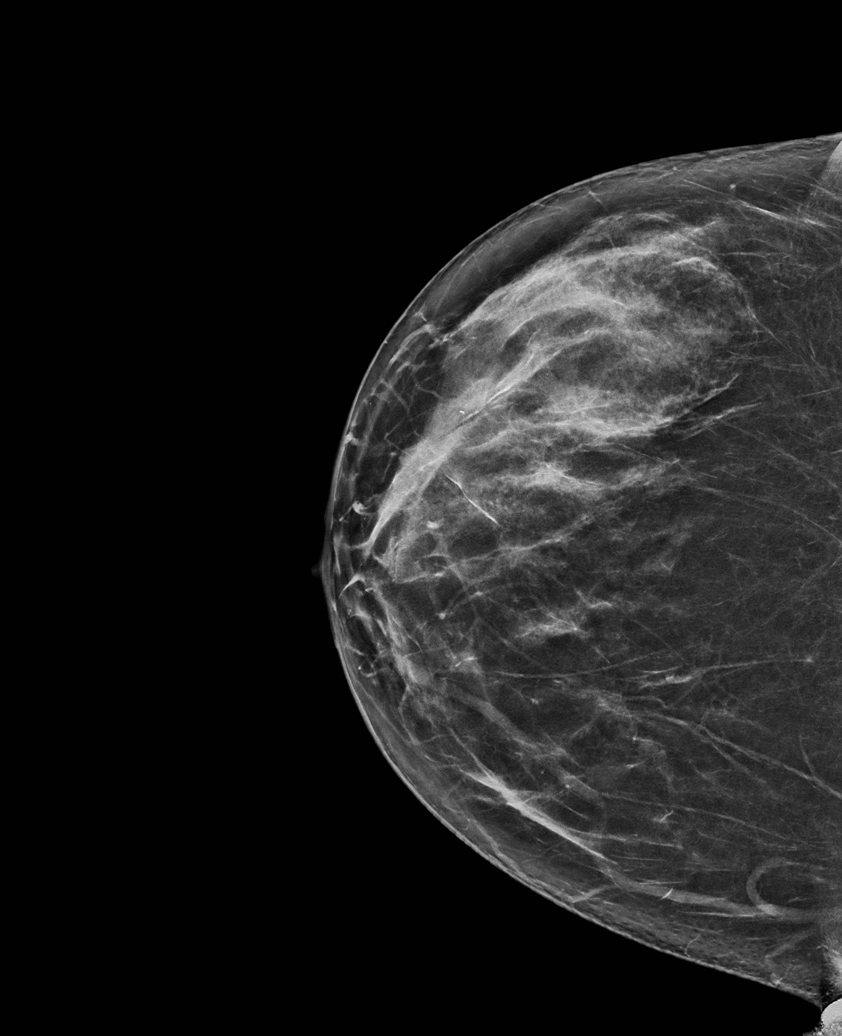

[R MLO synth-2D]
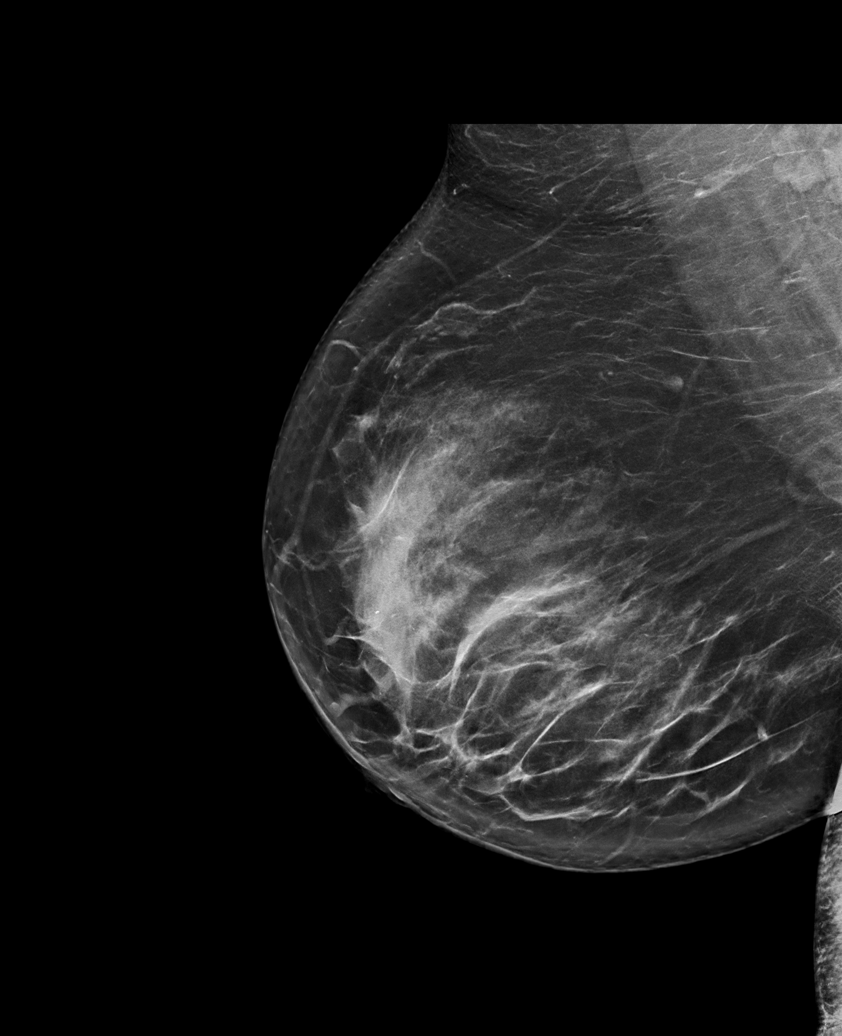

[L CV tomo · tomo slice 38/75.0]
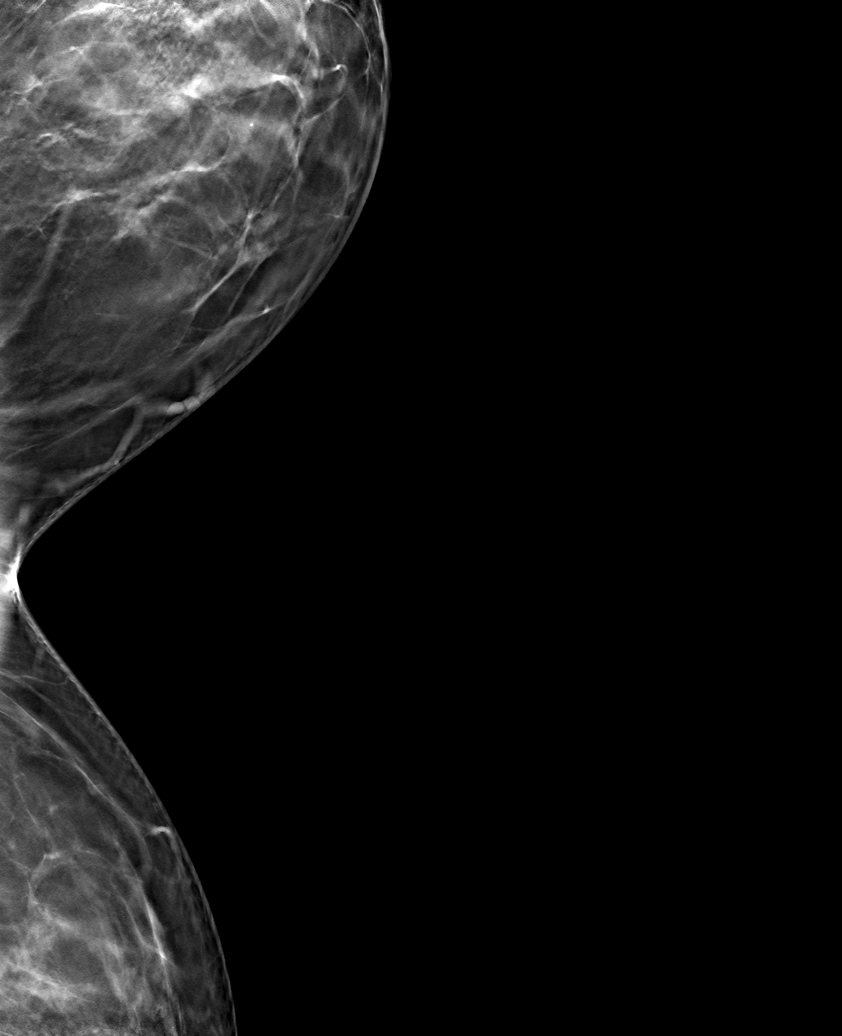

[6 of 30 positions shown; findings below may reference images not displayed]

ACR Breast Density Category b: There are scattered areas of
fibroglandular density.
FINDINGS: There are no findings suspicious for malignancy. Images were
processed with CAD.
IMPRESSION: No mammographic evidence of malignancy. A result letter of this
screening mammogram will be mailed directly to the patient.

RECOMMENDATION:
Screening mammogram in one year. (Code:[id number])

BI-RADS CATEGORY  1: Negative.
